# Patient Record
Sex: Female | Born: 1985 | Race: White | Hispanic: No | Marital: Single | Smoking: Never smoker
Health system: Southern US, Community
[De-identification: ages and names within clinical notes are randomized; demographics above are authoritative.]

## PROBLEM LIST (undated history)

## (undated) ENCOUNTER — Inpatient Hospital Stay: Payer: Self-pay

## (undated) ENCOUNTER — Inpatient Hospital Stay (HOSPITAL_COMMUNITY): Payer: Self-pay

## (undated) ENCOUNTER — Emergency Department (HOSPITAL_COMMUNITY): Admission: EM | Payer: Medicaid Other

## (undated) DIAGNOSIS — G43009 Migraine without aura, not intractable, without status migrainosus: Secondary | ICD-10-CM

## (undated) DIAGNOSIS — Z6835 Body mass index (BMI) 35.0-35.9, adult: Secondary | ICD-10-CM

## (undated) DIAGNOSIS — E669 Obesity, unspecified: Secondary | ICD-10-CM

## (undated) DIAGNOSIS — O99213 Obesity complicating pregnancy, third trimester: Secondary | ICD-10-CM

## (undated) HISTORY — PX: TONSILLECTOMY: SUR1361

---

## 2014-06-06 NOTE — L&D Delivery Note (Signed)
Delivery Note At 5:40 PM a viable preterm  female was delivered via Vaginal, Spontaneous Delivery (Presentation: Left Occiput Anterior).  APGAR: 8, 9; weight 4 lb 6.2 oz (1990 g).   Placenta status: Intact, Spontaneous. / marginal abruption Cord: 3 vessels with the following complications: Nuchal cord x1 reduced on perineum.    Anesthesia: Epidural  Episiotomy: None Lacerations: 1st degree;Labial bil Suture Repair: 3.0 chromic Est. Blood Loss (mL): 350    Baby to mother's abdomen and after a minute, cord was clamped and cut. Baby to warmer for initial evaluation by NNP then  back to mother for skin to skin.Pecola Leisure.Baby was breathing without difficulty and  will transition in NICU.  Kensie Susman 05/03/2015, 6:09 PM

## 2014-11-27 ENCOUNTER — Emergency Department
Admission: EM | Admit: 2014-11-27 | Discharge: 2014-11-27 | Disposition: A | Discharge status: Home / Self Care | Payer: Medicaid Other | Attending: Emergency Medicine | Admitting: Emergency Medicine

## 2014-11-27 ENCOUNTER — Encounter: Payer: Self-pay | Admitting: Urgent Care

## 2014-11-27 ENCOUNTER — Emergency Department: Payer: Medicaid Other

## 2014-11-27 DIAGNOSIS — Z79899 Other long term (current) drug therapy: Secondary | ICD-10-CM | POA: Diagnosis not present

## 2014-11-27 DIAGNOSIS — O2341 Unspecified infection of urinary tract in pregnancy, first trimester: Secondary | ICD-10-CM | POA: Insufficient documentation

## 2014-11-27 DIAGNOSIS — R1030 Lower abdominal pain, unspecified: Secondary | ICD-10-CM

## 2014-11-27 DIAGNOSIS — N390 Urinary tract infection, site not specified: Secondary | ICD-10-CM

## 2014-11-27 DIAGNOSIS — Z3A01 Less than 8 weeks gestation of pregnancy: Secondary | ICD-10-CM | POA: Diagnosis not present

## 2014-11-27 DIAGNOSIS — O200 Threatened abortion: Secondary | ICD-10-CM | POA: Diagnosis not present

## 2014-11-27 DIAGNOSIS — Z8742 Personal history of other diseases of the female genital tract: Secondary | ICD-10-CM

## 2014-11-27 DIAGNOSIS — O209 Hemorrhage in early pregnancy, unspecified: Secondary | ICD-10-CM | POA: Diagnosis present

## 2014-11-27 LAB — URINALYSIS COMPLETE WITH MICROSCOPIC (ARMC ONLY)
BILIRUBIN URINE: NEGATIVE
GLUCOSE, UA: NEGATIVE mg/dL
KETONES UR: NEGATIVE mg/dL
Nitrite: NEGATIVE
Protein, ur: 30 mg/dL — AB
SPECIFIC GRAVITY, URINE: 1.02 (ref 1.005–1.030)
pH: 6 (ref 5.0–8.0)

## 2014-11-27 LAB — CBC
HCT: 40.3 % (ref 35.0–47.0)
Hemoglobin: 13.5 g/dL (ref 12.0–16.0)
MCH: 29.8 pg (ref 26.0–34.0)
MCHC: 33.4 g/dL (ref 32.0–36.0)
MCV: 89.2 fL (ref 80.0–100.0)
Platelets: 242 10*3/uL (ref 150–440)
RBC: 4.52 MIL/uL (ref 3.80–5.20)
RDW: 13.7 % (ref 11.5–14.5)
WBC: 11.7 10*3/uL — ABNORMAL HIGH (ref 3.6–11.0)

## 2014-11-27 LAB — HCG, QUANTITATIVE, PREGNANCY: HCG, BETA CHAIN, QUANT, S: 88971 m[IU]/mL — AB (ref <5)

## 2014-11-27 LAB — ABO/RH
ABO/RH(D): A POS
ABO/RH(D): A POS

## 2014-11-27 MED ORDER — CEPHALEXIN 500 MG PO CAPS: 500.0000 mg | ORAL_CAPSULE | Freq: Once | ORAL | Status: DC

## 2014-11-27 MED ORDER — CEPHALEXIN 500 MG PO CAPS
500.0000 mg | ORAL_CAPSULE | Freq: Four times a day (QID) | ORAL | Status: AC
Start: 2014-11-27 — End: 2014-12-07

## 2014-11-27 NOTE — ED Provider Notes (Signed)
[facility] Emergency Department Provider Note  ____________________________________________  Time seen: Approximately 7:19 AM  I have reviewed the triage vital signs and the nursing notes.   HISTORY  Chief Complaint Vaginal Bleeding and Abdominal Cramping   HPI Peggy Gaines is a 29 y.o. female 's hand vaginal bleeding and some lower abdominal cramping. Patient states that she is [redacted] weeks pregnant and that the vaginal bleeding started yesterday. Patient states it initially started just a couple of drops of blood "and now it was more heavy this morning. Patient states that she is a G2 P0 with one miscarriage in the past. Patient denies any dysuria frequency or hematuria she denies any significant vaginal discharge other than the intermittent bleeding. Patient states she has been nauseated with this pregnancy but she is not vomiting. And she does not have any significant abdominal pain other than some mild suprapubic pain.   History reviewed. No pertinent past medical history.  There are no active problems to display for this patient.   History reviewed. No pertinent past surgical history.  Current Outpatient Rx  Name  Route  Sig  Dispense  Refill  . Prenatal Vit-Fe Fumarate-FA (PRENATAL MULTIVITAMIN) TABS tablet   Oral   Take 1 tablet by mouth daily at 12 noon.         . cephALEXin (KEFLEX) 500 MG capsule   Oral   Take 1 capsule (500 mg total) by mouth 4 (four) times daily.   40 capsule   0     Allergies Review of patient's allergies indicates no known allergies.  No family history on file.  Social History History  Substance Use Topics  . Smoking status: Never Smoker   . Smokeless tobacco: Not on file  . Alcohol Use: No    Review of Systems Constitutional: No fever/chills Eyes: No visual changes. ENT: No sore throat. Cardiovascular: Denies chest pain. Respiratory: Denies shortness of breath. Gastrointestinal: Mild abdominal pain.   No nausea, no vomiting.  No diarrhea.  No constipation. Genitourinary: Negative for dysuria. Intermittent vaginal bleeding and patient is in her first trimester pregnancy. Musculoskeletal: Negative for back pain. Skin: Negative for rash. Neurological: Negative for headaches, focal weakness or numbness.  10-point ROS otherwise negative.  ____________________________________________   PHYSICAL EXAM:  VITAL SIGNS: ED Triage Vitals  Enc Vitals Group     BP 11/27/14 0236 131/95 mmHg     Pulse Rate 11/27/14 0236 95     Resp 11/27/14 0236 18     Temp 11/27/14 0236 98.9 F (37.2 C)     Temp Source 11/27/14 0236 Oral     SpO2 11/27/14 0236 100 %     Weight 11/27/14 0236 251 lb (113.853 kg)     Height 11/27/14 0236  (1.727 m)     Head Cir --      Peak Flow --      Pain Score 11/27/14 0237 5     Pain Loc --      Pain Edu? --      Excl. in GC? --     Constitutional: Alert and oriented. Well appearing and in no acute distress. Eyes: Conjunctivae are normal. PERRL. EOMI. Head: Atraumatic. Nose: No congestion/rhinnorhea. Mouth/Throat: Mucous membranes are moist.  Oropharynx non-erythematous. Neck: No stridor.   Cardiovascular: Normal rate, regular rhythm. Grossly normal heart sounds.  Good peripheral circulation. Respiratory: Normal respiratory effort.  No retractions. Lungs CTAB. Gastrointestinal: Soft and nontender. No distention. No abdominal bruits. No CVA tenderness. Patient has  minimal suprapubic tenderness Musculoskeletal: No lower extremity tenderness nor edema.  No joint effusions. Neurologic:  Normal speech and language. No gross focal neurologic deficits are appreciated. Speech is normal. No gait instability. Skin:  Skin is warm, dry and intact. No rash noted. Psychiatric: Mood and affect are normal. Speech and behavior are normal. Patient artery had a transvaginal ultrasound. The patient is not wanting a pelvic exam this time because she says that she was somewhat sore  from that procedure. She denies any significant vaginal discharge other than the bleeding. The bleeding has slowed down at this time.  ____________________________________________   LABS (all labs ordered are listed, but only abnormal results are displayed)  Labs Reviewed  HCG, QUANTITATIVE, PREGNANCY - Abnormal; Notable for the following:    hCG, Beta Chain, Quant, S 05397 (*)    All other components within normal limits  URINALYSIS COMPLETEWITH MICROSCOPIC ([facility] ONLY) - Abnormal; Notable for the following:    Color, Urine YELLOW (*)    APPearance HAZY (*)    Hgb urine dipstick 3+ (*)    Protein, ur 30 (*)    Leukocytes, UA TRACE (*)    Bacteria, UA FEW (*)    Squamous Epithelial / LPF 0-5 (*)    All other components within normal limits  CBC - Abnormal; Notable for the following:    WBC 11.7 (*)    All other components within normal limits  ABO/RH  ABO/RH   ____________________________________________  EKG  None ____________________________________________  RADIOLOGY  US Ob Comp Less 14 Wks  11/27/2014   CLINICAL DATA:  Light vaginal bleeding for 2-3 hours. Initial encounter.  EXAM: OBSTETRIC <14 WK [place] AND TRANSVAGINAL OB US  TECHNIQUE: Both transabdominal and transvaginal ultrasound examinations were performed for complete evaluation of the gestation as well as the maternal uterus, adnexal regions, and pelvic cul-de-sac. Transvaginal technique was performed to assess early pregnancy.  COMPARISON:  None.  FINDINGS: Intrauterine gestational sac: Visualized/normal in shape.  Yolk sac:  No  Embryo:  Yes  Cardiac Activity: Yes  Heart Rate: 153  bpm  CRL:  4.47 cm   11 w   2 d                  [place] EDC: 06/16/2015  Maternal uterus/adnexae: No subchorionic hemorrhage is noted.  The ovaries are unremarkable in appearance. The right ovary measures 3.5 x 2.2 x 2.3 cm, while the left ovary measures 3.1 x 2.3 x 2.4 cm. A 4.6 x 3.9 x 2.9 cm cyst is noted superior to the left ovary,  essentially anechoic in nature.  No free fluid is seen within the pelvic cul-de-sac.  IMPRESSION: 1. Single live intrauterine pregnancy noted, with a crown-rump length measuring 4.5 cm, corresponding to a gestational age of [redacted] weeks 2 days. This does not match the gestational age by LMP, and reflects a new estimated date of delivery of June 16, 2015. 2. 4.6 cm simple cyst noted arising superior to the left ovary, likely physiologic in nature.   Electronically Signed   By: Roanna Raider M.D.   On: 11/27/2014 06:14   US Ob Transvaginal  11/27/2014   CLINICAL DATA:  Light vaginal bleeding for 2-3 hours. Initial encounter.  EXAM: OBSTETRIC <14 WK [place] AND TRANSVAGINAL OB US  TECHNIQUE: Both transabdominal and transvaginal ultrasound examinations were performed for complete evaluation of the gestation as well as the maternal uterus, adnexal regions, and pelvic cul-de-sac. Transvaginal technique was performed to assess early pregnancy.  COMPARISON:  None.  FINDINGS: Intrauterine gestational sac: Visualized/normal in shape.  Yolk sac:  No  Embryo:  Yes  Cardiac Activity: Yes  Heart Rate: 153  bpm  CRL:  4.47 cm   11 w   2 d                  [place] EDC: 06/16/2015  Maternal uterus/adnexae: No subchorionic hemorrhage is noted.  The ovaries are unremarkable in appearance. The right ovary measures 3.5 x 2.2 x 2.3 cm, while the left ovary measures 3.1 x 2.3 x 2.4 cm. A 4.6 x 3.9 x 2.9 cm cyst is noted superior to the left ovary, essentially anechoic in nature.  No free fluid is seen within the pelvic cul-de-sac.  IMPRESSION: 1. Single live intrauterine pregnancy noted, with a crown-rump length measuring 4.5 cm, corresponding to a gestational age of [redacted] weeks 2 days. This does not match the gestational age by LMP, and reflects a new estimated date of delivery of June 16, 2015. 2. 4.6 cm simple cyst noted arising superior to the left ovary, likely physiologic in nature.   Electronically Signed   By: Roanna Raider M.D.   On:  11/27/2014 06:14    ____________________________________________   PROCEDURES  Procedure(s) performed: None  Critical Care performed: No  ____________________________________________   INITIAL IMPRESSION / ASSESSMENT AND PLAN / ED COURSE  Pertinent labs & imaging results that were available during my care of the patient were reviewed by me and considered in my medical decision making (see chart for details).  Patient had pelvic ultrasound which showed that she was actually 11 weeks and 2 days and her quantitative beta analysis was elevated at a great level.. Patient's urinalysis showed that she had a pretty significant urinary tract infection. Patient is going to be placed on Keflex for her urinary infection and she was told to do pelvic rest for the next 3-4 days and to follow-up as soon as possible with her OB/GYN for the threatened miscarriage. She is to return immediately if condition worsens ____________________________________________   FINAL CLINICAL IMPRESSION(S) / ED DIAGNOSES  Final diagnoses:  Lower abdominal pain  Urinary tract infection without hematuria, site unspecified  Threatened miscarriage      Leona Carry, MD 11/27/14 [phone]

## 2014-11-27 NOTE — ED Notes (Signed)
Pt reports vaginal bleeding and generalized abd cramping. Currently [redacted] weeks pregnant per her last period. First pregnancy. States blood is dark red in color and initially was said to be more like spotting and later was more like a period. Urinary frequency the past few days.

## 2014-11-27 NOTE — ED Notes (Signed)
Patient presents with c/o moderate vaginal bleeding that began within the last hour. Patient denies "pain", however is experiencing some abdominal/pelvic cramping. Of note, patient is currently 6 weeks preg (G2P0A1s).

## 2014-12-04 ENCOUNTER — Other Ambulatory Visit: Payer: Self-pay | Admitting: Advanced Practice Midwife

## 2014-12-04 DIAGNOSIS — Z369 Encounter for antenatal screening, unspecified: Secondary | ICD-10-CM

## 2014-12-04 LAB — OB RESULTS CONSOLE HIV ANTIBODY (ROUTINE TESTING): HIV: NONREACTIVE

## 2014-12-05 LAB — OB RESULTS CONSOLE RPR: RPR: NONREACTIVE

## 2014-12-05 LAB — OB RESULTS CONSOLE HEPATITIS B SURFACE ANTIGEN: Hepatitis B Surface Ag: NEGATIVE

## 2014-12-05 LAB — OB RESULTS CONSOLE GC/CHLAMYDIA
Chlamydia: NEGATIVE
Gonorrhea: NEGATIVE

## 2015-01-16 LAB — OB RESULTS CONSOLE VARICELLA ZOSTER ANTIBODY, IGG: VARICELLA IGG: IMMUNE

## 2015-03-23 ENCOUNTER — Encounter: Payer: Self-pay | Admitting: *Deleted

## 2015-03-23 ENCOUNTER — Observation Stay
Admission: EM | Admit: 2015-03-23 | Discharge: 2015-03-23 | Disposition: A | Discharge status: Home / Self Care | Payer: Medicaid Other | Attending: Certified Nurse Midwife | Admitting: Certified Nurse Midwife

## 2015-03-23 DIAGNOSIS — O368120 Decreased fetal movements, second trimester, not applicable or unspecified: Secondary | ICD-10-CM | POA: Diagnosis not present

## 2015-03-23 DIAGNOSIS — Z3A27 27 weeks gestation of pregnancy: Secondary | ICD-10-CM | POA: Diagnosis not present

## 2015-03-23 DIAGNOSIS — O368190 Decreased fetal movements, unspecified trimester, not applicable or unspecified: Secondary | ICD-10-CM | POA: Diagnosis present

## 2015-03-23 HISTORY — DX: Obesity, unspecified: E66.9

## 2015-03-23 NOTE — OB Triage Note (Signed)
Recvd to OBS 2 per wheelchair complaining of hematemesis x1 today and decreased fetal movement.  States that she hasn't been feeling well for the last few days.  H/A, pressure, sore throat.  Denies ear pain.  Changed to gown and to bed.  EFM applied with use of US.

## 2015-03-23 NOTE — Plan of Care (Signed)
Discharge instructions given and explained.  Verbalized understanding.  Signed copy to chart and one in hand.

## 2015-03-23 NOTE — ED Notes (Signed)
Pt reports is approximately [redacted] weeks pregnant and this am she vomitted blood x's 1. Pt denies pain just reports feels nauseated.

## 2015-03-24 ENCOUNTER — Encounter: Payer: Self-pay | Admitting: Certified Nurse Midwife

## 2015-03-24 NOTE — Progress Notes (Signed)
L&D Triage Note  [age] year old G2 P0010 with EDC=06/16/2015 by an 11wk2d ultrasound presents to L&D at 27.6 weeks with c/o decreased fetal movement today. Has not been feeling well with URI sx x several days including sinus congestion, sore throat, headache. This AM vomited x1 and noticed some blood steaks in the vomitus. Has also had blood when blowing nose. Usually vomits 1-2 times a day. Takes Zantac for heartburn, but that has not been helping. Today has eaten some crackers and a bowl of soup. Has been on no meds for nausea.Has been drinking some water.   [facility] at [facility] and [facility] remarkable for obesity and UTIs x2. Has a history of migraines.Current medications include PNV and Zantac.  NKDA  Exam : General : in NAD Throat: no inflammation or exudates Neck: No LAN Nares: inflammed and irritated mucosa Heart: RRR without murmur Lungs: CTA FHR: 135 baseline with accelerations to 150s to 160s Toco: no ctxs Ultrasound: breech presentation, anterior/ fundal placenta AFI: 5.82+ 2.83+2.15cm=10.8 cm  A: IUP at 27.6 weeks with reactive NST and normal AFI Blood in emesis:  R/O Mallory Weiss tear vs blood from nares  P: DC home FKCs explained DC Zantac and start Protonix 40 mg daily Soft foods for the next week Sudafed and normal saline for nasal congestion Diclegis 2 caps before bed, can repeat one in AM #100/ RF x1 FU this week in office as scheduled.  Farrel ConnersGUTIERREZ, Peggy Gaines, CNM

## 2015-04-30 ENCOUNTER — Inpatient Hospital Stay
Admission: EM | Admit: 2015-04-30 | Discharge: 2015-05-05 | DRG: 775 | Disposition: A | Discharge status: Home / Self Care | Payer: Medicaid Other | Attending: Obstetrics and Gynecology | Admitting: Obstetrics and Gynecology

## 2015-04-30 DIAGNOSIS — Z23 Encounter for immunization: Secondary | ICD-10-CM

## 2015-04-30 DIAGNOSIS — O9081 Anemia of the puerperium: Secondary | ICD-10-CM | POA: Diagnosis not present

## 2015-04-30 DIAGNOSIS — Z3A33 33 weeks gestation of pregnancy: Secondary | ICD-10-CM

## 2015-04-30 DIAGNOSIS — Z6838 Body mass index (BMI) 38.0-38.9, adult: Secondary | ICD-10-CM

## 2015-04-30 DIAGNOSIS — D62 Acute posthemorrhagic anemia: Secondary | ICD-10-CM | POA: Diagnosis not present

## 2015-04-30 DIAGNOSIS — O42913 Preterm premature rupture of membranes, unspecified as to length of time between rupture and onset of labor, third trimester: Secondary | ICD-10-CM | POA: Diagnosis present

## 2015-04-30 DIAGNOSIS — Z789 Other specified health status: Secondary | ICD-10-CM | POA: Diagnosis not present

## 2015-04-30 DIAGNOSIS — O99213 Obesity complicating pregnancy, third trimester: Secondary | ICD-10-CM | POA: Diagnosis present

## 2015-04-30 DIAGNOSIS — O42919 Preterm premature rupture of membranes, unspecified as to length of time between rupture and onset of labor, unspecified trimester: Secondary | ICD-10-CM | POA: Diagnosis present

## 2015-04-30 HISTORY — DX: Obesity complicating pregnancy, third trimester: O99.213

## 2015-04-30 HISTORY — DX: Body mass index (BMI) 38.0-38.9, adult: Z68.38

## 2015-04-30 HISTORY — DX: Migraine without aura, not intractable, without status migrainosus: G43.009

## 2015-04-30 HISTORY — DX: Body mass index (BMI) 35.0-35.9, adult: Z68.35

## 2015-04-30 LAB — URINE DRUG SCREEN, QUALITATIVE (ARMC ONLY)
Amphetamines, Ur Screen: NOT DETECTED
BARBITURATES, UR SCREEN: NOT DETECTED
Benzodiazepine, Ur Scrn: NOT DETECTED
COCAINE METABOLITE, UR ~~LOC~~: NOT DETECTED
Cannabinoid 50 Ng, Ur ~~LOC~~: NOT DETECTED
MDMA (Ecstasy)Ur Screen: NOT DETECTED
METHADONE SCREEN, URINE: NOT DETECTED
Opiate, Ur Screen: NOT DETECTED
Phencyclidine (PCP) Ur S: NOT DETECTED
TRICYCLIC, UR SCREEN: NOT DETECTED

## 2015-04-30 LAB — URINALYSIS COMPLETE WITH MICROSCOPIC (ARMC ONLY)
BILIRUBIN URINE: NEGATIVE
Bacteria, UA: NONE SEEN
GLUCOSE, UA: NEGATIVE mg/dL
KETONES UR: NEGATIVE mg/dL
NITRITE: NEGATIVE
Protein, ur: NEGATIVE mg/dL
Specific Gravity, Urine: 1.01 (ref 1.005–1.030)
pH: 7 (ref 5.0–8.0)

## 2015-04-30 LAB — CBC
HEMATOCRIT: 38 % (ref 35.0–47.0)
HEMOGLOBIN: 12.9 g/dL (ref 12.0–16.0)
MCH: 30.8 pg (ref 26.0–34.0)
MCHC: 33.9 g/dL (ref 32.0–36.0)
MCV: 90.6 fL (ref 80.0–100.0)
Platelets: 275 10*3/uL (ref 150–440)
RBC: 4.2 MIL/uL (ref 3.80–5.20)
RDW: 14.6 % — ABNORMAL HIGH (ref 11.5–14.5)
WBC: 10.4 10*3/uL (ref 3.6–11.0)

## 2015-04-30 LAB — TYPE AND SCREEN
ABO/RH(D): A POS
ANTIBODY SCREEN: NEGATIVE

## 2015-04-30 LAB — CHLAMYDIA/NGC RT PCR (ARMC ONLY)
CHLAMYDIA TR: NOT DETECTED
N GONORRHOEAE: NOT DETECTED

## 2015-04-30 LAB — RAPID HIV SCREEN (HIV 1/2 AB+AG)
HIV 1/2 Antibodies: NONREACTIVE
HIV-1 P24 Antigen - HIV24: NONREACTIVE

## 2015-04-30 MED ORDER — LACTATED RINGERS IV SOLN
INTRAVENOUS | Status: DC
  Administered 2015-04-30: 16:00:00 via INTRAVENOUS
  Administered 2015-05-01: 125 mL via INTRAVENOUS
  Administered 2015-05-01 – 2015-05-03 (×3): via INTRAVENOUS

## 2015-04-30 MED ORDER — ERYTHROMYCIN BASE 250 MG PO TBEC
250.0000 mg | DELAYED_RELEASE_TABLET | Freq: Four times a day (QID) | ORAL | Status: DC
  Administered 2015-05-02 – 2015-05-03 (×2): 250 mg via ORAL
  Filled 2015-04-30 (×9): qty 1

## 2015-04-30 MED ORDER — AMOXICILLIN 250 MG PO CAPS
250.0000 mg | ORAL_CAPSULE | Freq: Three times a day (TID) | ORAL | Status: DC
  Administered 2015-05-02 – 2015-05-03 (×2): 250 mg via ORAL
  Filled 2015-04-30 (×7): qty 1

## 2015-04-30 MED ORDER — SODIUM CHLORIDE 0.9 % IV SOLN
250.0000 mg | Freq: Four times a day (QID) | INTRAVENOUS | Status: AC
  Administered 2015-04-30 – 2015-05-02 (×8): 250 mg via INTRAVENOUS
  Filled 2015-04-30 (×13): qty 5

## 2015-04-30 MED ORDER — OXYTOCIN 10 UNIT/ML IJ SOLN: 10.0000 [IU] | Freq: Once | INTRAMUSCULAR | Status: DC

## 2015-04-30 MED ORDER — BETAMETHASONE SOD PHOS & ACET 6 (3-3) MG/ML IJ SUSP
12.0000 mg | Freq: Every day | INTRAMUSCULAR | Status: AC
  Administered 2015-04-30 – 2015-05-01 (×2): 12 mg via INTRAMUSCULAR
  Filled 2015-04-30: qty 2

## 2015-04-30 MED ORDER — LIDOCAINE HCL (PF) 1 % IJ SOLN
30.0000 mL | INTRAMUSCULAR | Status: DC | PRN
  Filled 2015-04-30: qty 30

## 2015-04-30 MED ORDER — LACTATED RINGERS IV SOLN: 500.0000 mL | INTRAVENOUS | Status: DC | PRN

## 2015-04-30 MED ORDER — OXYTOCIN 40 UNITS IN LACTATED RINGERS INFUSION - SIMPLE MED: 62.5000 mL/h | INTRAVENOUS | Status: DC

## 2015-04-30 MED ORDER — CITRIC ACID-SODIUM CITRATE 334-500 MG/5ML PO SOLN
30.0000 mL | ORAL | Status: DC | PRN
Start: 2015-04-30 — End: 2015-05-01

## 2015-04-30 MED ORDER — SODIUM CHLORIDE 0.9 % IV SOLN
2.0000 g | Freq: Four times a day (QID) | INTRAVENOUS | Status: AC
  Administered 2015-04-30 – 2015-05-02 (×8): 2 g via INTRAVENOUS
  Filled 2015-04-30 (×11): qty 2000

## 2015-04-30 MED ORDER — ONDANSETRON HCL 4 MG/2ML IJ SOLN: 4.0000 mg | Freq: Four times a day (QID) | INTRAMUSCULAR | Status: DC | PRN

## 2015-04-30 MED ORDER — OXYTOCIN BOLUS FROM INFUSION: 500.0000 mL | INTRAVENOUS | Status: DC

## 2015-04-30 NOTE — H&P (Signed)
OB History & Physical   History of Present Illness:  Chief Complaint: vaginal bleeding  HPI:  Peggy Gaines is a 29 y.o. G2P0010 female at 365w2d dated by 11 week ultrasound.  Her pregnancy has been complicated by Obesity with BMI 38, failed 28 week 1 hour glucose tolerance test (Passed 3 hour test with all normal values).    She denies contractions.   She denies leakage of fluid.   She reports vginal bleeding.   She reports fetal movement.    She notes getting up at about 1130am today and going to urinate and having the sensation that she was having a period-like discharge.  When she wiped she noted some red-tinged fluid on the napkin and there was what appeared to be blood in the toilet water. She has continued to have small amount of blood-tinged discharge on the napkin when she urinates.  She denies abdominal pain, fever, chills, urinary symptoms, contractions.  She only notes mild soreness in her lower abdomen.  She had intercourse last night and had no bleeding afterward.  Maternal Medical History:   Past Medical History  Diagnosis Date  . Obesity   . Obesity affecting pregnancy in third trimester, antepartum 04/30/2015  . BMI 35.0-35.9,adult   . Migraine without aura     Past Surgical History  Procedure Laterality Date  . Tonsillectomy     Allergies: No Known Allergies  Prior to Admission medications   Medication Sig Start Date End Date Taking? Authorizing Provider  Prenatal Vit-Fe Fumarate-FA (PRENATAL MULTIVITAMIN) TABS tablet Take 1 tablet by mouth daily at 12 noon.    Historical Provider, MD   OB History  Gravida Para Term Preterm AB SAB TAB Ectopic Multiple Living  2    1 1         # Outcome Date GA Lbr Len/2nd Weight Sex Delivery Anes PTL Lv  2 Current           1 SAB               Prenatal care site: Initially [facility], [facility]  Social History: She  reports that she has never smoked. She does not have any smokeless tobacco history on  file. She reports that she does not drink alcohol or use illicit drugs.  Family History: family history is not on file.   Review of Systems: Negative x 10 systems reviewed except as noted in the HPI.    Physical Exam:  Vital Signs: BP 134/79 mmHg  Pulse 97  Temp(Src) 97.6 F (36.4 C) (Oral)  Resp 18  LMP 10/21/2014 (Exact Date) General: no acute distress.  HEENT: normocephalic, atraumatic Heart: regular rate & rhythm.  No murmurs/rubs/gallops Lungs: clear to auscultation bilaterally Abdomen: soft, gravid, non-tender;  EFW: 3.5 pounds Pelvic:   External: Normal external female genitalia  Cervix: Dilation: Closed /   /     ROM: + pooling; nitrazine not done due to frank blood in the pooling; + ferning (florid) Extremities: non-tender, symmetric, no edema bilaterally.    Neurologic: Alert & oriented x 3.    Pertinent Results:  Prenatal Labs: Blood type/Rh A positive  Antibody screen negative  Rubella Unknown  RPR Non Reactive  HBsAg negative  HIV negative  GC negative  Chlamydia negative  Genetic screening Declined  1 hour GTT 140  3 hour GTT 93, 163, 133, 110 (all normal)  GBS unknown   Baseline FHR: 130 beats/min   Variability: moderate   Accelerations: present   Decelerations:  absent Contractions: present frequency: infrequent Overall assessment: Category 1  Bedside Ultrasound:  Number of Fetus: 1  Presentation: cephalic, spine mat right  Fluid: subjectively low to absent  Placental Location: anterior/right  Assessment:  Peggy Gaines is a 29 y.o. G53P0010 female at [redacted]w[redacted]d with PPROM, obesity (with BMI 38), no evidence of preterm labor at this time.   Plan:  1. Admit to Labor & Delivery  2. CBC, T&S, Clrs, IVF 3. PPROM:  Betamethasone per protocol, latency PPROM antibiotics (ampicillin, erythromycin).  Discussed risk of chorioamnionitis, preterm labor, placental abruption.  Discussed that will need to deliver at 34 weeks.  Will have neonatology see patient to  counsel her regarding what to expect from a premature infant.  Will get UDS, gonorrhea/chlamydia NAAT, UA. 4. Rubella unknown :will collect with labs 5. GBS Collected today, pending.   6. Fetwal well-being: reassuring overall  Conard Novak, MD 04/30/2015 2:55 PM

## 2015-04-30 NOTE — Consult Note (Signed)
Neonatology Consult to Antenatal Patient:  I was asked by Dr. Jean RosenthalJackson to see this patient in order to provide antenatal counseling due to SROM at 33 2/7 weeks.  Ms. Peggy Gaines is being admitted today at 3233 2/[redacted] weeks GA following SROM at about 1130 today. She is a G2P0A1 A pos, GBS pending with a history of E. Coli UTI, treated during this pregnancy. The baby is female. She is currently not having active labor and her cervix is closed. She is getting BMZ and IV Ampicillin and Erythromycin.  I spoke with the patient and her fiance. We discussed the worst case scenario of delivery in the next 1-2 days, including usual DR management, possible respiratory complications and need for support, IV access, feedings (mother desires breast feeding, which was encouraged), LOS, Mortality and Morbidity, and long term outcomes. They had a few questions, which I answered. The father of the baby has ADHD and learning disabilities, per his report, so they understand the increase in risk for developmental problems for this infant. I offered a NICU tour to them and would be glad to come back if they have more questions later.  Thank you for asking me to see this patient.  Doretha Souhristie C. Seward Coran, MD Neonatologist  The total length of face-to-face or floor/unit time for this encounter was 25 minutes. Counseling and/or coordination of care was 20 minutes of the above.

## 2015-04-30 NOTE — OB Triage Note (Signed)
Ms. Peggy Gaines here with c/o vaginal bleeding this am, describes as pink/bloody mucous after visit to bathroom. Pt reports most recent intercourse as last night. Pt also reports some mild "period-like" cramps as well over the past couple of days, intermittently, go away with rest.

## 2015-05-01 MED ORDER — INFLUENZA VAC SPLIT QUAD 0.5 ML IM SUSY
0.5000 mL | PREFILLED_SYRINGE | INTRAMUSCULAR | Status: AC
  Administered 2015-05-04: 0.5 mL via INTRAMUSCULAR
  Filled 2015-05-01 (×2): qty 0.5

## 2015-05-01 NOTE — Progress Notes (Signed)
Patient ID: Peggy Gaines, female   DOB: 02/02/1986, 29 y.o.   MRN: [id number] Daily Antepartum Progress Note Peggy Gaines  [id number]  HD#2 Subjective:  Overnight Events: None Complaints: None She denies: fevers, chills, chest pain, trouble breathing, nausea, vomiting, severe abdominal pain, vaginal bleeding (apart from scant blood-tinged fluid continuing to leak). Also, denies contractions. She notes good fetal movement. She has tolerated: normal diet as tolerated She is ambulating and is voiding.   Objective:  Most recent vitals Temp: 98.1 F (36.7 C)  BP: 112/62 mmHg  Pulse Rate: 81  Resp: 18      Vitals Range over 24 hours BP  Min: 107/59  Max: 134/79 Temp  Avg: 98.3 F (36.8 C)  Min: 97.6 F (36.4 C)  Max: 98.8 F (37.1 C) Pulse  Avg: 86.7  Min: 76  Max: 97   Physical Exam General: alert, well appearing, and in no distress Heart: regular rate and rhythm, no murmurs Lungs: clear to auscultation, no wheezes, rales or rhonchi, symmetric air entry Abdomen: abdomen is soft without significant tenderness, masses, organomegaly or guarding. Gravid, non-tender Extremities: no redness or tenderness in the calves or thighs, no edema  EFM: 130bpm baseline, +accels, no decels, moderate variability. TOCO: No ctx  AM Labs Lab Results  Component Value Date   WBC 10.4 04/30/2015   HGB 12.9 04/30/2015   HCT 38.0 04/30/2015   PLT 275 04/30/2015     Assessment:  Peggy Gaines is a 29 y.o. female at 7280w3d gestational age HD/#2 admitted with PPROM. No signs of infection, labor, or abruption.   Plan:  1) PPROM: continue latency antibiotics, second dose of betamethasone today.  Rubella lab pending. GBS pending 2) FWB: reassuring overall 3) DVT Prophylaxis:  SCDs  Conard NovakJackson, Nyeli Holtmeyer D, MD  05/01/2015 11:32 AM

## 2015-05-01 NOTE — Plan of Care (Signed)
SCD's applied and explained to pt.

## 2015-05-01 NOTE — Progress Notes (Signed)
L&D Progress Note  S: in no pain. No contractions  O: afebrile. 90/53 Tolerating regular diet FHR 130s with accelerations to 150s, moderate variability Toco: essentially acontractile Just received second dose of BMZ  A: PPROM at 33 2/7 weeks, now 33 3/7 weeks Cat 1 FHR tracing No evidence of chorio  P[facility] Continue antibiotics as ordered Plan IOL at 34 weeks (tentatively scheduled for 29 November NST twice daily starting tomorrow Temp and vs q4hours FHTs q8hours SCDs and BR with BRPs  Delma Drone, CNM

## 2015-05-02 LAB — CBC WITH DIFFERENTIAL/PLATELET
Basophils Absolute: 0 10*3/uL (ref 0–0.1)
Basophils Relative: 0 %
EOS PCT: 0 %
Eosinophils Absolute: 0 10*3/uL (ref 0–0.7)
HEMATOCRIT: 33.7 % — AB (ref 35.0–47.0)
HEMOGLOBIN: 11 g/dL — AB (ref 12.0–16.0)
LYMPHS ABS: 0.9 10*3/uL — AB (ref 1.0–3.6)
LYMPHS PCT: 7 %
MCH: 29.8 pg (ref 26.0–34.0)
MCHC: 32.5 g/dL (ref 32.0–36.0)
MCV: 91.6 fL (ref 80.0–100.0)
Monocytes Absolute: 0.5 10*3/uL (ref 0.2–0.9)
Monocytes Relative: 4 %
NEUTROS ABS: 11.4 10*3/uL — AB (ref 1.4–6.5)
NEUTROS PCT: 89 %
Platelets: 247 10*3/uL (ref 150–440)
RBC: 3.68 MIL/uL — AB (ref 3.80–5.20)
RDW: 14.4 % (ref 11.5–14.5)
WBC: 12.8 10*3/uL — AB (ref 3.6–11.0)

## 2015-05-02 NOTE — Progress Notes (Signed)
[facility] Pt alert and orientated, procedure reviewed with pt, no questions or concerns voiced by pt.

## 2015-05-02 NOTE — Progress Notes (Signed)
Patient ID: Peggy Gaines, female   DOB: 12/27/1985, 29 y.o.   MRN: [id number] Daily Antepartum Progress Note Peggy Gaines  [id number]  HD#3 Subjective:  Overnight Events: None Complaints: None She denies: fevers, chills, chest pain, trouble breathing, nausea, vomiting, severe abdominal pain, vaginal bleeding, contractions. She notes good fetal movement. She has tolerated: normal diet as tolerated She is ambulating and is voiding.   Objective:  Most recent vitals Temp: 97.8 F (36.6 C)  BP: 111/66 mmHg  Pulse Rate: 86  Resp: 18  SpO2: 98 %   Vitals Range over 24 hours BP  Min: 90/53  Max: 122/72 Temp  Avg: 98.4 F (36.9 C)  Min: 97.8 F (36.6 C)  Max: 99 F (37.2 C) Pulse  Avg: 78.7  Min: 67  Max: 87 SpO2  Avg: 98.7 %  Min: 98 %  Max: 99 %   Physical Exam General: alert, well appearing, and in no distress Heart: regular rate and rhythm, no murmurs Lungs: clear to auscultation, no wheezes, rales or rhonchi, symmetric air entry Abdomen: abdomen is soft without significant tenderness, masses, organomegaly or guarding. Gravid, non-tender Extremities: no redness or tenderness in the calves or thighs, no edema  EFM: 130bpm baseline, +accels, no decels, moderate variability. R NST yesterday. TOCO: No ctx  AM Labs Lab Results  Component Value Date   WBC 10.4 04/30/2015   HGB 12.9 04/30/2015   HCT 38.0 04/30/2015   PLT 275 04/30/2015     Assessment:  Peggy Gaines is a 29 y.o. female at 692w4d gestational age HD/#3 admitted with PPROM. No signs of infection, labor, or abruption.   Plan:  1) PPROM: continue latency antibiotics, change to po ABX today.  Has received 2 doses of betamethasone.        CBC today.  Cont to monitor for fever, infection, or labor or bleeding.       Plan IOL at 34 weeks (Tues). 2) FWB: reassuring overall 3) DVT Prophylaxis:  SCDs  Letitia LibraHARRIS,Ashanna Heinsohn PAUL, MD  05/02/2015 9:28 AM

## 2015-05-02 NOTE — Progress Notes (Signed)
A NST procedure was performed with FHR monitoring and a normal baseline established, appropriate time of 20-40 minutes of evaluation, and accels >2 seen w 15x15 characteristics.  Results show a REACTIVE NST.   Baseline 130s.  No decels. No ctxs.  Labs reviewed.  WBC up slightly to 12.8.  Hgb 11. No fever.  Cont to monitor

## 2015-05-02 NOTE — Progress Notes (Signed)
Nst complete, pt for transfer to back to her room.

## 2015-05-03 ENCOUNTER — Inpatient Hospital Stay: Payer: Medicaid Other | Admitting: Anesthesiology

## 2015-05-03 ENCOUNTER — Encounter: Payer: Self-pay | Admitting: Anesthesiology

## 2015-05-03 LAB — RUBELLA SCREEN: Rubella: <0.9 index — ABNORMAL LOW (ref >0.99)

## 2015-05-03 LAB — CBC
HCT: 34.6 % — ABNORMAL LOW (ref 35.0–47.0)
Hemoglobin: 11.4 g/dL — ABNORMAL LOW (ref 12.0–16.0)
MCH: 30.1 pg (ref 26.0–34.0)
MCHC: 33 g/dL (ref 32.0–36.0)
MCV: 91.3 fL (ref 80.0–100.0)
PLATELETS: 268 10*3/uL (ref 150–440)
RBC: 3.79 MIL/uL — ABNORMAL LOW (ref 3.80–5.20)
RDW: 15 % — AB (ref 11.5–14.5)
WBC: 15.1 10*3/uL — ABNORMAL HIGH (ref 3.6–11.0)

## 2015-05-03 LAB — RPR: RPR Ser Ql: NONREACTIVE

## 2015-05-03 MED ORDER — MISOPROSTOL 200 MCG PO TABS
800.0000 ug | ORAL_TABLET | Freq: Once | ORAL | Status: DC | PRN
  Filled 2015-05-03: qty 4

## 2015-05-03 MED ORDER — SODIUM CHLORIDE 0.9 % IV SOLN
INTRAVENOUS | Status: AC
  Administered 2015-05-03: 1 g via INTRAVENOUS
  Filled 2015-05-03: qty 1000

## 2015-05-03 MED ORDER — SCOPOLAMINE 1 MG/3DAYS TD PT72
1.0000 | MEDICATED_PATCH | Freq: Once | TRANSDERMAL | Status: DC
Start: 2015-05-03 — End: 2015-05-05
  Filled 2015-05-03: qty 1

## 2015-05-03 MED ORDER — WITCH HAZEL-GLYCERIN EX PADS: 1.0000 "application " | MEDICATED_PAD | CUTANEOUS | Status: DC | PRN

## 2015-05-03 MED ORDER — KETOROLAC TROMETHAMINE 30 MG/ML IJ SOLN: 30.0000 mg | Freq: Four times a day (QID) | INTRAMUSCULAR | Status: DC | PRN

## 2015-05-03 MED ORDER — ONDANSETRON HCL 4 MG/2ML IJ SOLN: 4.0000 mg | INTRAMUSCULAR | Status: DC | PRN

## 2015-05-03 MED ORDER — OXYTOCIN 40 UNITS IN LACTATED RINGERS INFUSION - SIMPLE MED
62.5000 mL/h | INTRAVENOUS | Status: DC
  Administered 2015-05-03: 62.5 mL/h via INTRAVENOUS
  Filled 2015-05-03: qty 1000

## 2015-05-03 MED ORDER — BUTORPHANOL TARTRATE 1 MG/ML IJ SOLN
INTRAMUSCULAR | Status: AC
  Administered 2015-05-03: 2 mg via INTRAVENOUS
  Filled 2015-05-03: qty 2

## 2015-05-03 MED ORDER — DOCUSATE SODIUM 100 MG PO CAPS
100.0000 mg | ORAL_CAPSULE | Freq: Every day | ORAL | Status: DC
  Administered 2015-05-04 – 2015-05-05 (×2): 100 mg via ORAL
  Filled 2015-05-03 (×3): qty 1

## 2015-05-03 MED ORDER — OXYTOCIN 40 UNITS IN LACTATED RINGERS INFUSION - SIMPLE MED: 62.5000 mL/h | INTRAVENOUS | Status: DC | PRN

## 2015-05-03 MED ORDER — ONDANSETRON HCL 4 MG/2ML IJ SOLN: 4.0000 mg | Freq: Four times a day (QID) | INTRAMUSCULAR | Status: DC | PRN

## 2015-05-03 MED ORDER — DIBUCAINE 1 % RE OINT: 1.0000 "application " | TOPICAL_OINTMENT | RECTAL | Status: DC | PRN

## 2015-05-03 MED ORDER — BUTORPHANOL TARTRATE 1 MG/ML IJ SOLN
2.0000 mg | INTRAMUSCULAR | Status: DC | PRN
  Administered 2015-05-03 (×2): 2 mg via INTRAVENOUS
  Filled 2015-05-03: qty 2

## 2015-05-03 MED ORDER — TERBUTALINE SULFATE 1 MG/ML IJ SOLN
0.2500 mg | Freq: Once | INTRAMUSCULAR | Status: DC | PRN
  Filled 2015-05-03: qty 1

## 2015-05-03 MED ORDER — NALOXONE HCL 0.4 MG/ML IJ SOLN: 0.4000 mg | INTRAMUSCULAR | Status: DC | PRN

## 2015-05-03 MED ORDER — MEPERIDINE HCL 25 MG/ML IJ SOLN: 6.2500 mg | INTRAMUSCULAR | Status: DC | PRN

## 2015-05-03 MED ORDER — LACTATED RINGERS IV SOLN
INTRAVENOUS | Status: DC
  Administered 2015-05-03: 500 mL via INTRAVENOUS
  Administered 2015-05-03: 13:00:00 via INTRAVENOUS

## 2015-05-03 MED ORDER — DIPHENHYDRAMINE HCL 25 MG PO CAPS: 25.0000 mg | ORAL_CAPSULE | ORAL | Status: DC | PRN

## 2015-05-03 MED ORDER — IBUPROFEN 600 MG PO TABS
600.0000 mg | ORAL_TABLET | Freq: Four times a day (QID) | ORAL | Status: DC
  Administered 2015-05-03 – 2015-05-05 (×5): 600 mg via ORAL
  Filled 2015-05-03 (×5): qty 1

## 2015-05-03 MED ORDER — BUPIVACAINE HCL (PF) 0.25 % IJ SOLN
INTRAMUSCULAR | Status: DC | PRN
  Administered 2015-05-03: 5 mL via EPIDURAL

## 2015-05-03 MED ORDER — SODIUM CHLORIDE 0.9 % IV SOLN
1.0000 g | INTRAVENOUS | Status: DC
  Administered 2015-05-03 (×3): 1 g via INTRAVENOUS
  Filled 2015-05-03 (×2): qty 1000

## 2015-05-03 MED ORDER — NALBUPHINE HCL 10 MG/ML IJ SOLN
5.0000 mg | Freq: Once | INTRAMUSCULAR | Status: DC | PRN
  Filled 2015-05-03: qty 0.5

## 2015-05-03 MED ORDER — LACTATED RINGERS IV SOLN: 500.0000 mL | INTRAVENOUS | Status: DC | PRN

## 2015-05-03 MED ORDER — DIPHENHYDRAMINE HCL 50 MG/ML IJ SOLN: 12.5000 mg | INTRAMUSCULAR | Status: DC | PRN

## 2015-05-03 MED ORDER — FERROUS SULFATE 325 (65 FE) MG PO TABS
325.0000 mg | ORAL_TABLET | Freq: Every day | ORAL | Status: DC
  Administered 2015-05-04 – 2015-05-05 (×2): 325 mg via ORAL
  Filled 2015-05-03 (×2): qty 1

## 2015-05-03 MED ORDER — DEXTROSE 5 % IV SOLN
1.0000 ug/kg/h | INTRAVENOUS | Status: DC | PRN
  Filled 2015-05-03: qty 2

## 2015-05-03 MED ORDER — FENTANYL 2.5 MCG/ML W/ROPIVACAINE 0.2% IN NS 100 ML EPIDURAL INFUSION (ARMC-ANES)
EPIDURAL | Status: AC
  Administered 2015-05-03: 10 mL/h via EPIDURAL
  Filled 2015-05-03: qty 100

## 2015-05-03 MED ORDER — OXYTOCIN BOLUS FROM INFUSION
500.0000 mL | INTRAVENOUS | Status: DC
  Administered 2015-05-03: 500 mL via INTRAVENOUS

## 2015-05-03 MED ORDER — LANOLIN HYDROUS EX OINT: TOPICAL_OINTMENT | CUTANEOUS | Status: DC | PRN

## 2015-05-03 MED ORDER — NALBUPHINE HCL 10 MG/ML IJ SOLN
5.0000 mg | INTRAMUSCULAR | Status: DC | PRN
  Filled 2015-05-03: qty 0.5

## 2015-05-03 MED ORDER — SIMETHICONE 80 MG PO CHEW: 80.0000 mg | CHEWABLE_TABLET | ORAL | Status: DC | PRN

## 2015-05-03 MED ORDER — AMMONIA AROMATIC IN INHA: 0.3000 mL | Freq: Once | RESPIRATORY_TRACT | Status: DC | PRN

## 2015-05-03 MED ORDER — PRENATAL MULTIVITAMIN CH
1.0000 | ORAL_TABLET | Freq: Every day | ORAL | Status: DC
  Administered 2015-05-04 – 2015-05-05 (×2): 1 via ORAL
  Filled 2015-05-03 (×3): qty 1

## 2015-05-03 MED ORDER — ONDANSETRON HCL 4 MG/2ML IJ SOLN: 4.0000 mg | Freq: Three times a day (TID) | INTRAMUSCULAR | Status: DC | PRN

## 2015-05-03 MED ORDER — OXYTOCIN 40 UNITS IN LACTATED RINGERS INFUSION - SIMPLE MED
1.0000 m[IU]/min | INTRAVENOUS | Status: DC
  Administered 2015-05-03: 1 m[IU]/min via INTRAVENOUS

## 2015-05-03 MED ORDER — SODIUM CHLORIDE 0.9 % IJ SOLN: 3.0000 mL | INTRAMUSCULAR | Status: DC | PRN

## 2015-05-03 MED ORDER — BENZOCAINE-MENTHOL 20-0.5 % EX AERO
1.0000 "application " | INHALATION_SPRAY | CUTANEOUS | Status: DC | PRN
  Administered 2015-05-05: 1 via TOPICAL
  Filled 2015-05-03: qty 56

## 2015-05-03 MED ORDER — ONDANSETRON HCL 4 MG PO TABS: 4.0000 mg | ORAL_TABLET | ORAL | Status: DC | PRN

## 2015-05-03 MED ORDER — LIDOCAINE HCL (PF) 1 % IJ SOLN
30.0000 mL | INTRAMUSCULAR | Status: DC | PRN
  Filled 2015-05-03: qty 30

## 2015-05-03 NOTE — Discharge Summary (Signed)
Physician Obstetric Discharge Summary  Patient ID: Peggy Gaines MRN: [id number] DOB/AGE: 06-26-1985 29 y.o.   Date of Admission: 04/30/2015  Date of Discharge: 05/05/2015  Admitting Diagnosis: premature preterm rupture of membranes at [redacted]w[redacted]d Secondary Diagnosis: none  Mode of Delivery: normal spontaneous vaginal delivery on 05/03/2015       Discharge Diagnosis: PPROM, Preterm labor and delivery at 33wk5d/ Nuchal cord x1. Suspected marginal abruption   Intrapartum Procedures: epidural, GBS prophylaxis, pitocin augmentation, placement of intrauterine catheter and repair of superficial bilateral lacerations   Post partum procedures: MMR  Complications: None   Brief Hospital Course  Peggy Kimmeyis a [id number] was admitted 11/24 with PPROM of blood tinged amniotic fluid. She was monitored for 24 hours, given betamethasone x 2 doses, and was begun on latency antibiotics. She started into labor on 11/27 AM and  had a SVD on 05/03/2015 after a Pitocin augmented labor delivering a 4#6oz female in occiput anterior with nuchal cord x1;  for further details of this delivery, please refer to the delivery note.  Patient had an uncomplicated postpartum course.  By time of discharge on PPD#2, her pain was controlled on oral pain medications; she had appropriate lochia and was ambulating, voiding without difficulty and tolerating regular diet.  She was deemed stable for discharge to home.     Labs: CBC Latest Ref Rng 05/04/2015 05/03/2015 05/02/2015  WBC 3.6 - 11.0 K/uL 13.4(H) 15.1(H) 12.8(H)  Hemoglobin 12.0 - 16.0 g/dL 11.0(L) 11.4(L) 11.0(L)  Hematocrit 35.0 - 47.0 % 32.9(L) 34.6(L) 33.7(L)  Platelets 150 - 440 K/uL 229 268 247   A POS// RI//VI// GBS negative. TDAP given 04/09/15  Physical exam:  Blood pressure 119/77, pulse 76, temperature 98.5 F (36.9 C), temperature source Oral, resp. rate 18, height 5' 8"  (1.727 m), weight 254 lb (115.214 kg), last menstrual period  10/21/2014, SpO2 99 %, unknown if currently breastfeeding. Patient is pumping General: alert and no distress Lochia: appropriate Abdomen: soft, NT Uterine Fundus: firm at U/ML/NT Extremities: No evidence of DVT seen on physical exam. No lower extremity edema.  Discharge Instructions: Per After Visit Summary. Activity: Advance as tolerated. Pelvic rest for 6 weeks.  Also refer to Discharge Instructions Diet: Regular Medications:   Medication List    STOP taking these medications        Doxylamine-Pyridoxine 10-10 MG Tbec      TAKE these medications        benzocaine-Menthol 20-0.5 % Aero  Commonly known as:  DERMOPLAST  Apply 1 application topically as needed for irritation (perineal discomfort).     ibuprofen 600 MG tablet  Commonly known as:  ADVIL,MOTRIN  Take 1 tablet (600 mg total) by mouth every 6 (six) hours as needed.     lanolin Oint  Apply 1 application topically as needed (for breast care).     pantoprazole 40 MG tablet  Commonly known as:  PROTONIX  Take 40 mg by mouth daily.     prenatal multivitamin Tabs tablet  Take 1 tablet by mouth daily at 12 noon.       Outpatient follow up:  Follow-up Information    Follow up with Ednah Hammock, CNM. Call in 3 days.   Specialty:  Certified Nurse Midwife   Why:  6week check   Contact information:   [address] [phone]     Postpartum contraception: undecided, discussed options  Discharged Condition: stable  Discharged to: home   Newborn Data: Disposition:NICU  Apgars:  APGAR (1 MIN): 8   APGAR (5 MINS): 9   APGAR (10 MINS):    Baby Feeding: Breast/ 4#6oz//Peggy Gaines, CNM 05/05/2015 9:22 AM

## 2015-05-03 NOTE — Progress Notes (Addendum)
L&D Progress Note   S: Brought back to L&D with c/o frequent contractions starting around 0400. Contractions becoming progressively more intense. Rates them a 4/10. Continue to have leakage of blood tinged fluid.  O: General: appears uncomfortable, some moaning with contractions BP 138/84 mmHg  Pulse 69  Temp(Src) 98 F (36.7 C) (Oral)  Resp 18  Ht  (1.727 m)  Wt 254 lb (115.214 kg)  BMI 38.63 kg/m2  SpO2 98%  LMP 10/21/2014 (Exact Date)  Abdomen: tender fundus with contractions, difficulty palpating ctxs or picking them up with toco.  FHR: 135 with accelerations to 150s to 160s, mod variability Toco: contractions ?q1-2 minutes Cervix: 2/90%/-2 (difficulty feeling left side of cx)/vtx on ultrasound (OP) Results for orders placed or performed during the hospital encounter of 04/30/15 (from the past 72 hour(s))  CBC     Status: Abnormal   Collection Time: 04/30/15  4:28 PM  Result Value Ref Range   WBC 10.4 3.6 - 11.0 K/uL   RBC 4.20 3.80 - 5.20 MIL/uL   Hemoglobin 12.9 12.0 - 16.0 g/dL   HCT 16.1 09.6 - 04.5 %   MCV 90.6 80.0 - 100.0 fL   MCH 30.8 26.0 - 34.0 pg   MCHC 33.9 32.0 - 36.0 g/dL   RDW 40.9 (H) 81.1 - 91.4 %   Platelets 275 150 - 440 K/uL  Type and screen     Status: None   Collection Time: 04/30/15  4:28 PM  Result Value Ref Range   ABO/RH(D) A POS    Antibody Screen NEG    Sample Expiration 05/03/2015   Urinalysis complete, with microscopic ([facility] only)     Status: Abnormal   Collection Time: 04/30/15  4:28 PM  Result Value Ref Range   Color, Urine YELLOW (A) YELLOW   APPearance HAZY (A) CLEAR   Glucose, UA NEGATIVE NEGATIVE mg/dL   Bilirubin Urine NEGATIVE NEGATIVE   Ketones, ur NEGATIVE NEGATIVE mg/dL   Specific Gravity, Urine 1.010 1.005 - 1.030   Hgb urine dipstick 1+ (A) NEGATIVE   pH 7.0 5.0 - 8.0   Protein, ur NEGATIVE NEGATIVE mg/dL   Nitrite NEGATIVE NEGATIVE   Leukocytes, UA TRACE (A) NEGATIVE   RBC / HPF 6-30 0 - 5 RBC/hpf   WBC, UA 0-5  0 - 5 WBC/hpf   Bacteria, UA NONE SEEN NONE SEEN   Squamous Epithelial / LPF 6-30 (A) NONE SEEN   Mucous PRESENT   Urine Drug Screen, Qualitative ([facility] only)     Status: None   Collection Time: 04/30/15  4:28 PM  Result Value Ref Range   Tricyclic, Ur Screen NONE DETECTED NONE DETECTED   Amphetamines, Ur Screen NONE DETECTED NONE DETECTED   MDMA (Ecstasy)Ur Screen NONE DETECTED NONE DETECTED   Cocaine Metabolite,Ur [place] NONE DETECTED NONE DETECTED   Opiate, Ur Screen NONE DETECTED NONE DETECTED   Phencyclidine (PCP) Ur S NONE DETECTED NONE DETECTED   Cannabinoid 50 Ng, Ur [place] NONE DETECTED NONE DETECTED   Barbiturates, Ur Screen NONE DETECTED NONE DETECTED   Benzodiazepine, Ur Scrn NONE DETECTED NONE DETECTED   Methadone Scn, Ur NONE DETECTED NONE DETECTED    Comment: (NOTE) 100  Tricyclics, urine               Cutoff 1000 ng/mL 200  Amphetamines, urine             Cutoff 1000 ng/mL 300  MDMA (Ecstasy), urine  Cutoff 500 ng/mL 400  Cocaine Metabolite, urine       Cutoff 300 ng/mL 500  Opiate, urine                   Cutoff 300 ng/mL 600  Phencyclidine (PCP), urine      Cutoff 25 ng/mL 700  Cannabinoid, urine              Cutoff 50 ng/mL 800  Barbiturates, urine             Cutoff 200 ng/mL 900  Benzodiazepine, urine           Cutoff 200 ng/mL 1000 Methadone, urine                Cutoff 300 ng/mL 1100 1200 The urine drug screen provides only a preliminary, unconfirmed 1300 analytical test result and should not be used for non-medical 1400 purposes. Clinical consideration and professional judgment should 1500 be applied to any positive drug screen result due to possible 1600 interfering substances. A more specific alternate chemical method 1700 must be used in order to obtain a confirmed analytical result.  1800 Gas chromato graphy / mass spectrometry (GC/MS) is the preferred 1900 confirmatory method.   Chlamydia/NGC rt PCR ([facility] only)     Status: None   Collection Time:  04/30/15  4:28 PM  Result Value Ref Range   Specimen source GC/Chlam ENDOCERVICAL    Chlamydia Tr NOT DETECTED NOT DETECTED   N gonorrhoeae NOT DETECTED NOT DETECTED    Comment: (NOTE) 100  This methodology has not been evaluated in pregnant women or in 200  patients with a history of hysterectomy. 300 400  This methodology will not be performed on patients less than 26  years of age.   Rapid HIV screen (HIV 1/2 Ab+Ag) ([facility] Only)     Status: None   Collection Time: 04/30/15  4:28 PM  Result Value Ref Range   HIV-1 P24 Antigen - HIV24 NON REACTIVE NON REACTIVE   HIV 1/2 Antibodies NON REACTIVE NON REACTIVE   Interpretation (HIV Ag Ab)      A non reactive test result means that HIV 1 or HIV 2 antibodies and HIV 1 p24 antigen were not detected in the specimen.  Culture, beta strep (group b only)     Status: None (Preliminary result)   Collection Time: 04/30/15  4:28 PM  Result Value Ref Range   Specimen Description VAGINAL/RECTAL    Special Requests NONE    Culture NO BETA HEMOLYTIC STREPTOCOCCI ISOLATED    Report Status PENDING   CBC with Differential/Platelet     Status: Abnormal   Collection Time: 05/02/15  9:48 AM  Result Value Ref Range   WBC 12.8 (H) 3.6 - 11.0 K/uL   RBC 3.68 (L) 3.80 - 5.20 MIL/uL   Hemoglobin 11.0 (L) 12.0 - 16.0 g/dL   HCT 91.4 (L) 78.2 - 95.6 %   MCV 91.6 80.0 - 100.0 fL   MCH 29.8 26.0 - 34.0 pg   MCHC 32.5 32.0 - 36.0 g/dL   RDW 21.3 08.6 - 57.8 %   Platelets 247 150 - 440 K/uL   Neutrophils Relative % 89 %   Neutro Abs 11.4 (H) 1.4 - 6.5 K/uL   Lymphocytes Relative 7 %   Lymphs Abs 0.9 (L) 1.0 - 3.6 K/uL   Monocytes Relative 4 %   Monocytes Absolute 0.5 0.2 - 0.9 K/uL   Eosinophils Relative 0 %   Eosinophils Absolute  0.0 0 - 0.7 K/uL   Basophils Relative 0 %   Basophils Absolute 0.0 0 - 0.1 K/uL   A: PPROM 11/24, now in early labor A mild decrease in hemoglobin?dilutional effect. R/O abruption Cat 1 tracing  P: Discussed plan of  management with Dr Tiburcio PeaHarris: Pitocin if labor does not progress spontaneously DC po antibiotics-restart Ampicillin IV Repeat CBC Stadol for pain  Malayiah Mcbrayer, CNM

## 2015-05-03 NOTE — Anesthesia Procedure Notes (Signed)
Epidural Patient location during procedure: OB  Staffing Anesthesiologist: Peggy AddisonHOMAS, Peggy Gaines Performed by: anesthesiologist   Preanesthetic Checklist Completed: patient identified, site marked, surgical consent, pre-op evaluation, timeout performed, IV checked, risks and benefits discussed and monitors and equipment checked  Epidural Patient position: sitting Prep: Betadine Patient monitoring: heart rate, continuous pulse ox and blood pressure Approach: midline Location: L4-L5 Injection technique: LOR saline  Needle:  Needle type: Tuohy  Needle gauge: 18 G Needle length: 9 cm and 9 Catheter type: closed end flexible Catheter size: 20 Guage Test dose: negative and 1.5% lidocaine with Epi 1:200 K  Assessment Sensory level: T10 Events: blood not aspirated, injection not painful, no injection resistance, negative IV test and no paresthesia  Additional Notes   Patient tolerated the insertion well without complications. 1635 test. 1639 bolus. Infusion 1643.Reason for block:procedure for pain

## 2015-05-03 NOTE — Anesthesia Preprocedure Evaluation (Signed)
Anesthesia Evaluation  Patient identified by MRN, date of birth, ID band Patient awake    Reviewed: Allergy & Precautions, NPO status , Patient's Chart, lab work & pertinent test results, reviewed documented beta blocker date and time   Airway Mallampati: II  TM Distance: >3 FB     Dental  (+) Chipped   Pulmonary           Cardiovascular      Neuro/Psych  Headaches,    GI/Hepatic   Endo/Other    Renal/GU      Musculoskeletal   Abdominal   Peds  Hematology   Anesthesia Other Findings Obese.  Reproductive/Obstetrics                             Anesthesia Physical Anesthesia Plan  ASA: III  Anesthesia Plan: Epidural   Post-op Pain Management:    Induction:   Airway Management Planned:   Additional Equipment:   Intra-op Plan:   Post-operative Plan:   Informed Consent: I have reviewed the patients History and Physical, chart, labs and discussed the procedure including the risks, benefits and alternatives for the proposed anesthesia with the patient or authorized representative who has indicated his/her understanding and acceptance.     Plan Discussed with:   Anesthesia Plan Comments:         Anesthesia Quick Evaluation

## 2015-05-03 NOTE — Progress Notes (Signed)
L&D Progress Note  S: moaning with contractions. Ready for epidural  O: BP 110/96 mmHg  Pulse 66  Temp(Src) 98.1 F (36.7 C) (Oral)  Resp 18  Ht 5\' 8"  (1.727 m)  Wt 254 lb (115.214 kg)  BMI 38.63 kg/m2  SpO2 98%  LMP 10/21/2014 (Exact Date)   Contractions q2 min on 3 miu/min Pitocin FHR: 130s with accelerations to 150s to 180 Cervix: 4/C/-1  A: Progressing in labor  P: Epidural anesthesia Anival Pasha, CNM

## 2015-05-03 NOTE — Plan of Care (Signed)
Problem: Life Cycle: Goal: Risk for postpartum hemorrhage will decrease Outcome: Progressing Lochial Flow WNL. Fundus Firm at U/-1  Problem: Nutritional: Goal: Dietary intake will improve Outcome: Progressing Tolerating Reg. Diet.

## 2015-05-03 NOTE — Progress Notes (Signed)
L&D Progress Note  S: Stadol decreased contractions, but starting to feel them again  O: BP 140/78 mmHg  Pulse 77  Temp(Src) 98.3 F (36.8 C) (Oral)  Resp 18  Ht 5\' 8"  (1.727 m)  Wt 254 lb (115.214 kg)  BMI 38.63 kg/m2  SpO2 98%  LMP 10/21/2014 (Exact Date)  Pitocin begun a little earlier and is now at 2 miu/min Difficulty seeing contractions with toco Cervix: almost feels like a LEEP cx-found opening in completely effaced cervix and able to manipulate and dilate to 2.5 cm before inserting IUPC. Contractions q2-3 min apart FHR: 135 with accels to 160s, moderate variability. No significant decelerations Results for orders placed or performed during the hospital encounter of 04/30/15 (from the past 24 hour(s))  CBC     Status: Abnormal   Collection Time: 05/03/15  8:57 AM  Result Value Ref Range   WBC 15.1 (H) 3.6 - 11.0 K/uL   RBC 3.79 (L) 3.80 - 5.20 MIL/uL   Hemoglobin 11.4 (L) 12.0 - 16.0 g/dL   HCT 16.134.6 (L) 09.635.0 - 04.547.0 %   MCV 91.3 80.0 - 100.0 fL   MCH 30.1 26.0 - 34.0 pg   MCHC 33.0 32.0 - 36.0 g/dL   RDW 40.915.0 (H) 81.111.5 - 91.414.5 %   Platelets 268 150 - 440 K/uL   A: PPROM since 04/30/15 s/p BMZ x2 with rising WBCs and onset of labor this AM. ?early chorio Now augmenting labor to help move toward delivery  P: Continue Pitocin Epidural when appropriate  Jarmal Lewelling, CNM

## 2015-05-04 LAB — CULTURE, BETA STREP (GROUP B ONLY)

## 2015-05-04 LAB — CBC
HEMATOCRIT: 32.9 % — AB (ref 35.0–47.0)
HEMOGLOBIN: 11 g/dL — AB (ref 12.0–16.0)
MCH: 30.4 pg (ref 26.0–34.0)
MCHC: 33.4 g/dL (ref 32.0–36.0)
MCV: 91.2 fL (ref 80.0–100.0)
Platelets: 229 10*3/uL (ref 150–440)
RBC: 3.61 MIL/uL — ABNORMAL LOW (ref 3.80–5.20)
RDW: 14.5 % (ref 11.5–14.5)
WBC: 13.4 10*3/uL — ABNORMAL HIGH (ref 3.6–11.0)

## 2015-05-04 NOTE — Progress Notes (Signed)
  Subjective:  No concerns, doing well.  Minimal lochia  Objective:   Blood pressure 113/70, pulse 79, temperature 98.2 F (36.8 C), temperature source Oral, resp. rate 18, height $RemoveBe'5\' 8"'MHKLZJKaP$  (1.727 m), weight 115.214 kg (254 lb), last menstrual period 10/21/2014, SpO2 100 %, unknown if currently breastfeeding.  General: NAD Pulmonary: no increased work of breathing Abdomen: non-distended, non-tender, fundus firm at level of umbilicus Extremities: no edema, no erythema, no tenderness  Results for orders placed or performed during the hospital encounter of 04/30/15 (from the past 72 hour(s))  CBC with Differential/Platelet     Status: Abnormal   Collection Time: 05/02/15  9:48 AM  Result Value Ref Range   WBC 12.8 (H) 3.6 - 11.0 K/uL   RBC 3.68 (L) 3.80 - 5.20 MIL/uL   Hemoglobin 11.0 (L) 12.0 - 16.0 g/dL   HCT 33.7 (L) 35.0 - 47.0 %   MCV 91.6 80.0 - 100.0 fL   MCH 29.8 26.0 - 34.0 pg   MCHC 32.5 32.0 - 36.0 g/dL   RDW 14.4 11.5 - 14.5 %   Platelets 247 150 - 440 K/uL   Neutrophils Relative % 89 %   Neutro Abs 11.4 (H) 1.4 - 6.5 K/uL   Lymphocytes Relative 7 %   Lymphs Abs 0.9 (L) 1.0 - 3.6 K/uL   Monocytes Relative 4 %   Monocytes Absolute 0.5 0.2 - 0.9 K/uL   Eosinophils Relative 0 %   Eosinophils Absolute 0.0 0 - 0.7 K/uL   Basophils Relative 0 %   Basophils Absolute 0.0 0 - 0.1 K/uL  CBC     Status: Abnormal   Collection Time: 05/03/15  8:57 AM  Result Value Ref Range   WBC 15.1 (H) 3.6 - 11.0 K/uL   RBC 3.79 (L) 3.80 - 5.20 MIL/uL   Hemoglobin 11.4 (L) 12.0 - 16.0 g/dL   HCT 34.6 (L) 35.0 - 47.0 %   MCV 91.3 80.0 - 100.0 fL   MCH 30.1 26.0 - 34.0 pg   MCHC 33.0 32.0 - 36.0 g/dL   RDW 15.0 (H) 11.5 - 14.5 %   Platelets 268 150 - 440 K/uL  CBC     Status: Abnormal   Collection Time: 05/04/15  4:43 AM  Result Value Ref Range   WBC 13.4 (H) 3.6 - 11.0 K/uL   RBC 3.61 (L) 3.80 - 5.20 MIL/uL   Hemoglobin 11.0 (L) 12.0 - 16.0 g/dL   HCT 32.9 (L) 35.0 - 47.0 %   MCV 91.2  80.0 - 100.0 fL   MCH 30.4 26.0 - 34.0 pg   MCHC 33.4 32.0 - 36.0 g/dL   RDW 14.5 11.5 - 14.5 %   Platelets 229 150 - 440 K/uL    Assessment:   29 y.o. [id number] postpartum day # 1 PTSVD  Plan:    1) Acute blood loss anemia - hemodynamically stable and asymptomatic - po ferrous sulfate  2) A pos / RNI / VZI $Rem'[ ]'pbel$  MMR on discharge  5) Disposition - anticipate discharge PPD#2

## 2015-05-04 NOTE — Lactation Note (Signed)
Lactation Consultation Note  Patient Name: Stormy FabianShanna Larrivee ZOXWR'UToday's Date: 05/04/2015  Mom has [employer] DEBP set up in room.  Encouraged to pump every 2 to 3 hours for stimulation.  Label any expressed breast milk with time and date pumped and take to SCN.  [facility].    Maternal Data    Feeding    [facility]/Interventions                      Lactation Tools Discussed/Used     Consult Status      Louis MeckelWilliams, Tyjon Bowen Kay 05/04/2015, 9:50 PM

## 2015-05-04 NOTE — Anesthesia Postprocedure Evaluation (Signed)
Anesthesia Post Note  Patient: Peggy Gaines  Procedure(s) Performed: * No procedures listed *  Patient location during evaluation: [facility] Unit Anesthesia Type: Epidural Level of consciousness: awake and alert Pain management: satisfactory to patient Vital Signs Assessment: post-procedure vital signs reviewed and stable Respiratory status: spontaneous breathing Cardiovascular status: stable Postop Assessment: No headache, No backache and No signs of nausea or vomiting Anesthetic complications: no    Last Vitals:  Filed Vitals:   05/04/15 0250 05/04/15 0716  BP: 112/69 113/70  Pulse: 75 79  Temp: 36.8 C 36.8 C  Resp: 18 18    Last Pain:  Filed Vitals:   05/04/15 0717  PainSc: 0-No pain                 Deryn Massengale,  Margit BandaJohn M

## 2015-05-05 DIAGNOSIS — Z789 Other specified health status: Secondary | ICD-10-CM | POA: Diagnosis not present

## 2015-05-05 HISTORY — DX: Other specified health status: Z78.9

## 2015-05-05 LAB — SURGICAL PATHOLOGY

## 2015-05-05 MED ORDER — BENZOCAINE-MENTHOL 20-0.5 % EX AERO: 1.0000 "application " | INHALATION_SPRAY | CUTANEOUS | Status: DC | PRN

## 2015-05-05 MED ORDER — LANOLIN HYDROUS EX OINT: 1.0000 "application " | TOPICAL_OINTMENT | CUTANEOUS | Status: DC | PRN

## 2015-05-05 MED ORDER — IBUPROFEN 600 MG PO TABS: 600.0000 mg | ORAL_TABLET | Freq: Four times a day (QID) | ORAL | Status: DC | PRN

## 2015-05-05 MED ORDER — MEASLES, MUMPS & RUBELLA VAC ~~LOC~~ INJ
0.5000 mL | INJECTION | Freq: Once | SUBCUTANEOUS | Status: AC
  Administered 2015-05-05: 0.5 mL via SUBCUTANEOUS
  Filled 2015-05-05: qty 0.5

## 2015-05-05 NOTE — Progress Notes (Signed)
Discharge instructions complete. Patient discharged home at 1525.

## 2015-05-05 NOTE — Discharge Instructions (Signed)
° °Vaginal Delivery, Care After °Refer to this sheet in the next few weeks. These discharge instructions provide you with information on caring for yourself after delivery. Your caregiver may also give you specific instructions. Your treatment has been planned according to the most current medical practices available, but problems sometimes occur. Call your caregiver if you have any problems or questions after you go home. °HOME CARE INSTRUCTIONS °1. Take over-the-counter or prescription medicines only as directed by your caregiver or pharmacist. °2. Do not drink alcohol, especially if you are breastfeeding or taking medicine to relieve pain. °3. Do not smoke tobacco. °4. Continue to use good perineal care. Good perineal care includes: °1. Wiping your perineum from back to front °2. Keeping your perineum clean. °3. You can do sitz baths twice a day, to help keep this area clean °5. Do not use tampons, douche or have sex until your caregiver says it is okay. °6. Shower only and avoid sitting in submerged water, aside from sitz baths °7. Wear a well-fitting bra that provides breast support. °8. Eat healthy foods. °9. Drink enough fluids to keep your urine clear or pale yellow. °10. Eat high-fiber foods such as whole grain cereals and breads, brown rice, beans, and fresh fruits and vegetables every day. These foods may help prevent or relieve constipation. °11. Avoid constipation with high fiber foods or medications, such as miralax or metamucil °12. Follow your caregiver's recommendations regarding resumption of activities such as climbing stairs, driving, lifting, exercising, or traveling. °13. Talk to your caregiver about resuming sexual activities. Resumption of sexual activities is dependent upon your risk of infection, your rate of healing, and your comfort and desire to resume sexual activity. °14. Try to have someone help you with your household activities and your newborn for at least a few days after you  leave the hospital. °15. Rest as much as possible. Try to rest or take a nap when your newborn is sleeping. °16. Increase your activities gradually. °17. Keep all of your scheduled postpartum appointments. It is very important to keep your scheduled follow-up appointments. At these appointments, your caregiver will be checking to make sure that you are healing physically and emotionally. °SEEK MEDICAL CARE IF:  °· You are passing large clots from your vagina. Save any clots to show your caregiver. °· You have a foul smelling discharge from your vagina. °· You have trouble urinating. °· You are urinating frequently. °· You have pain when you urinate. °· You have a change in your bowel movements. °· You have increasing redness, pain, or swelling near your vaginal incision (episiotomy) or vaginal tear. °· You have pus draining from your episiotomy or vaginal tear. °· Your episiotomy or vaginal tear is separating. °· You have painful, hard, or reddened breasts. °· You have a severe headache. °· You have blurred vision or see spots. °· You feel sad or depressed. °· You have thoughts of hurting yourself or your newborn. °· You have questions about your care, the care of your newborn, or medicines. °· You are dizzy or light-headed. °· You have a rash. °· You have nausea or vomiting. °· You were breastfeeding and have not had a menstrual period within 12 weeks after you stopped breastfeeding. °· You are not breastfeeding and have not had a menstrual period by the 12th week after delivery. °· You have a fever. °SEEK IMMEDIATE MEDICAL CARE IF:  °· You have persistent pain. °· You have chest pain. °· You have shortness of breath. °·   You faint.  You have leg pain.  You have stomach pain.  Your vaginal bleeding saturates two or more sanitary pads in 1 hour. MAKE SURE YOU:   Understand these instructions.  Will watch your condition.  Will get help right away if you are not doing well or get worse. Document Released:  05/20/2000 Document Revised: 10/07/2013 Document Reviewed: 01/18/2012 [facility] Patient Information 2015 [employer], [place]. This information is not intended to replace advice given to you by your health care provider. Make sure you discuss any questions you have with your health care provider.  Sitz Bath A sitz bath is a warm water bath taken in the sitting position. The water covers only the hips and butt (buttocks). We recommend using one that fits in the toilet, to help with ease of use and cleanliness. It may be used for either healing or cleaning purposes. Sitz baths are also used to relieve pain, itching, or muscle tightening (spasms). The water may contain medicine. Moist heat will help you heal and relax.  HOME CARE  Take 3 to 4 sitz baths a day. 18. Fill the bathtub half-full with warm water. 19. Sit in the water and open the drain a little. 20. Turn on the warm water to keep the tub half-full. Keep the water running constantly. 21. Soak in the water for 15 to 20 minutes. 22. After the sitz bath, pat the affected area dry. GET HELP RIGHT AWAY IF: You get worse instead of better. Stop the sitz baths if you get worse. MAKE SURE YOU:  Understand these instructions.  Will watch your condition.  Will get help right away if you are not doing well or get worse. Document Released: 06/30/2004 Document Revised: 02/15/2012 Document Reviewed: 09/20/2010 [facility] Patient Information 2015 [employer], [place]. This information is not intended to replace advice given to you by your health care provider. Make sure you discuss any questions you have with your health care provider.     We will discuss your surgery once again in detail at your post-op visit in two to four weeks. If you havent already done so, please call to make your appointment as soon as possible.  [place] [place]  [address]  [place], [place] [address] [place], [place] [address]  Phone: [phone] Phone:  [phone]  Fax: [phone] Fax: [phone]

## 2015-05-14 ENCOUNTER — Ambulatory Visit: Payer: Self-pay

## 2015-05-14 NOTE — Lactation Note (Signed)
This note was copied from the chart of Peggy Gaines. Lactation Consultation Note  Patient Name: Peggy Gaines ONGEX'BToday's Date: 05/14/2015 Reason for consult: Follow-up assessment   Maternal Data   Peggy PleasureMoatehr has had poor milk supply, seh now is making 30 to 40 ml every 2 to 4 hours. Feeding Feeding Type: Breast Fed Length of feed: 15 min  LATCH Score/Interventions Latch: Grasps breast easily, tongue down, lips flanged, rhythmical sucking. Intervention(s): Teach feeding cues Intervention(s): Breast compression;Adjust position  Audible Swallowing: Spontaneous and intermittent Intervention(s): Hand expression  Type of Nipple: Everted at rest and after stimulation  Comfort (Breast/Nipple): Soft / non-tender  Problem noted: Filling  Hold (Positioning): Assistance needed to correctly position infant at breast and maintain latch.  LATCH Score: 9  Lactation Tools Discussed/Used Tools: Pump;16F feeding tube / Syringe Nipple shield size: 24 WIC Program: Yes   Consult Status Consult Status: Follow-up    Trudee GripCarolyn P Donyel Nester 05/14/2015, 4:50 PM

## 2015-05-27 ENCOUNTER — Ambulatory Visit
Admission: RE | Admit: 2015-05-27 | Discharge: 2015-05-27 | Disposition: A | Discharge status: Home / Self Care | Payer: Medicaid Other | Source: Ambulatory Visit | Attending: Pediatrics | Admitting: Pediatrics

## 2015-05-27 NOTE — Lactation Note (Signed)
Lactation Consultation Note  Patient Name: Peggy Gaines Peggy Gaines: 05/27/2015     Maternal Data  Mom states she pumps with a Lactina breast pump "every 3 hours", but gets 40-60 ml total each time. She last pumped 40ml 2 1/2 hours ago. She denies taking any medication. She uses 30 mm flanges. She denies taking any medications or having had any illness. Sh ewould like the baby to get all breastmilk and to "mostly" breastfeed. Breasts are quite flaccid. With instruction on hand expression, she was able to get a drop of her milk on nipple. Mom has no risk factors known for low milk supply other than not breastfeeding, Lactina pump and perhaps insufficient pumping frequency. (Mom vague about how often she pumps).    Feeding  Baby alert and rooting despite having 60 ml EBM / HMF an hour ago. I inserted pumped milk into shield chamber (mom needed much help with 24 mm shield which too big now on left breast. Right nipple is larger, so 24 better there). Baby latches and nurses quite well as long as I give some EBM via curved syringe to keep her interested. She nursed for approx 15 minutes in the football hold and took the 16 ml from the syringe. Nothing from the breast alone per the pre/post weight check. Mom finished feedign with slow flow bottle. Has pediatrician appointment tomorrow.  PLAN: Continue to pump at least 8-12 times per 24 hours for 15 minutes each time (hand expresssion before/during/after) and feed baby with slow flow as directed by MD, except now also try 1-2 breastfeedings per day with the nipple [name]. I wouldn't spend much more than 15-20 minutes with each session for now. F/U with LC in 2 weeks  [facility]                      Lactation Tools Discussed/Used     Consult Status      Peggy Gaines

## 2017-05-09 IMAGING — US US OB COMP LESS 14 WK
1 series · 13 of 28 positions shown · non-contrast
Comparison: None.

CLINICAL DATA: Light vaginal bleeding for 2-3 hours. Initial
encounter.

EXAM:
OBSTETRIC <14 WK US AND TRANSVAGINAL OB US
TECHNIQUE: Both transabdominal and transvaginal ultrasound examinations were
performed for complete evaluation of the gestation as well as the
maternal uterus, adnexal regions, and pelvic cul-de-sac.
Transvaginal technique was performed to assess early pregnancy.

[Series 1: us ob comp less 14 wk · 0.28mm/px · 13 of 57 slices shown]
[im 3/57]
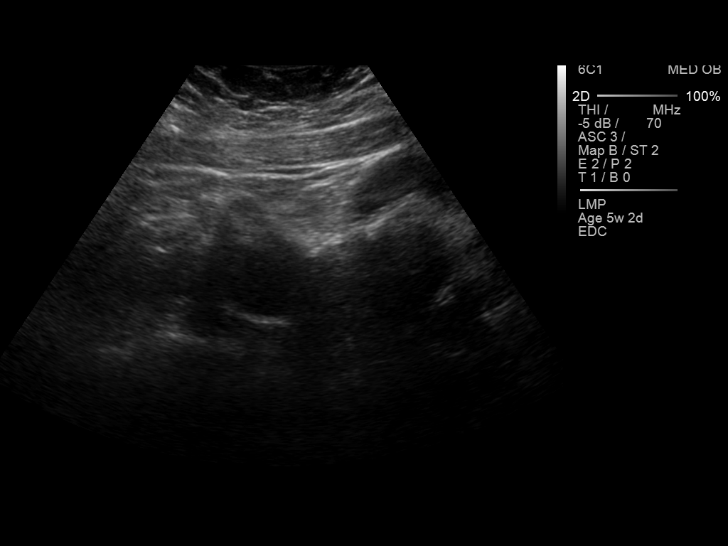
[im 7/57]
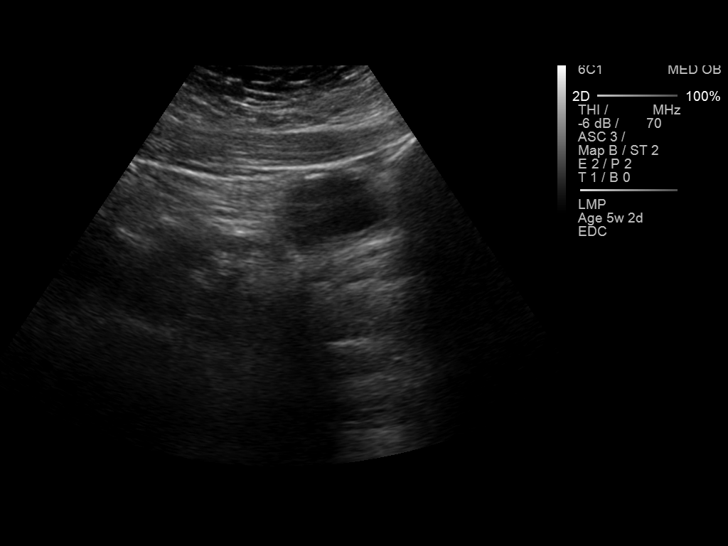
[im 11/57]
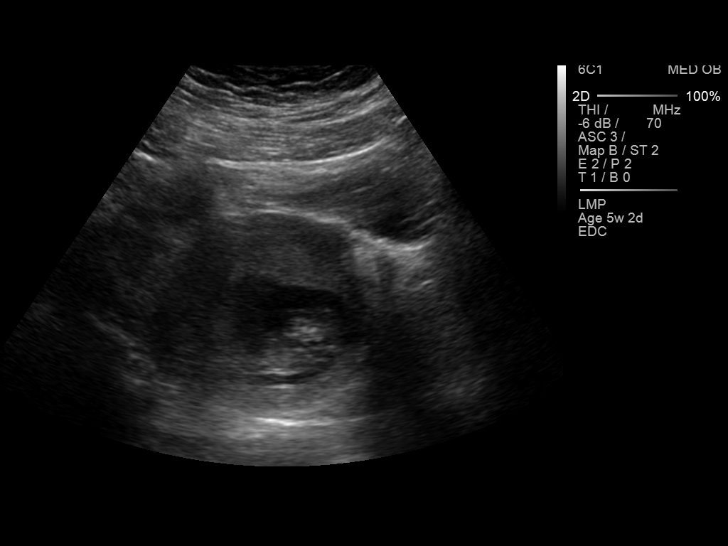
[im 15/57]
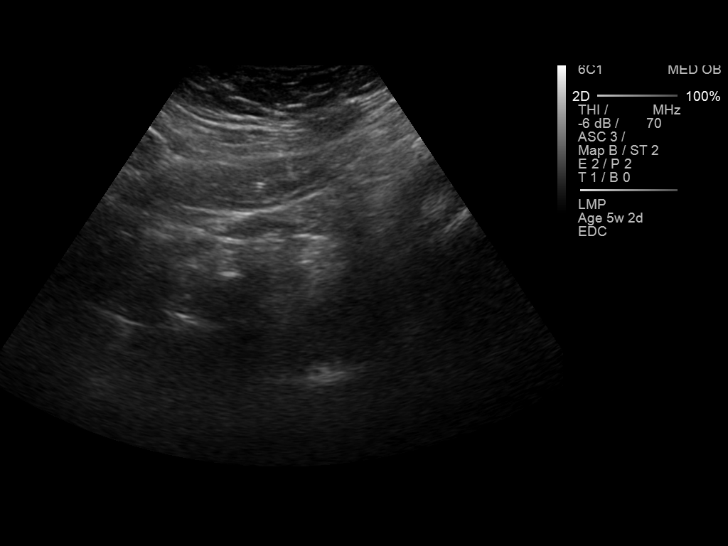
[im 19/57]
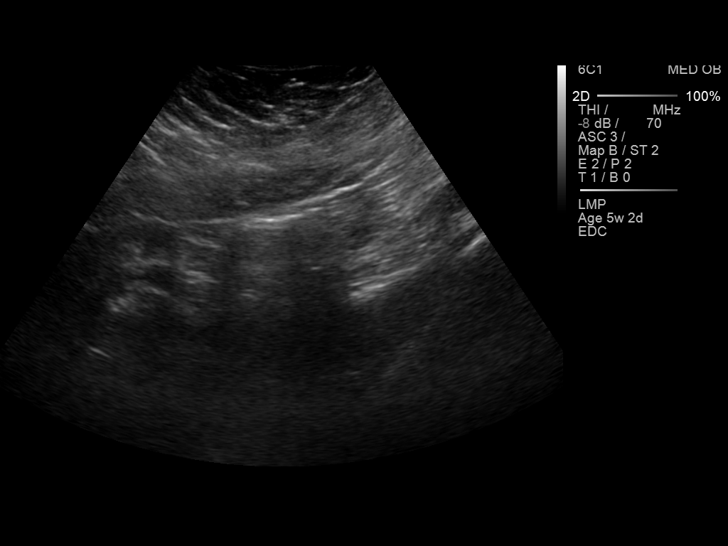
[im 23/57]
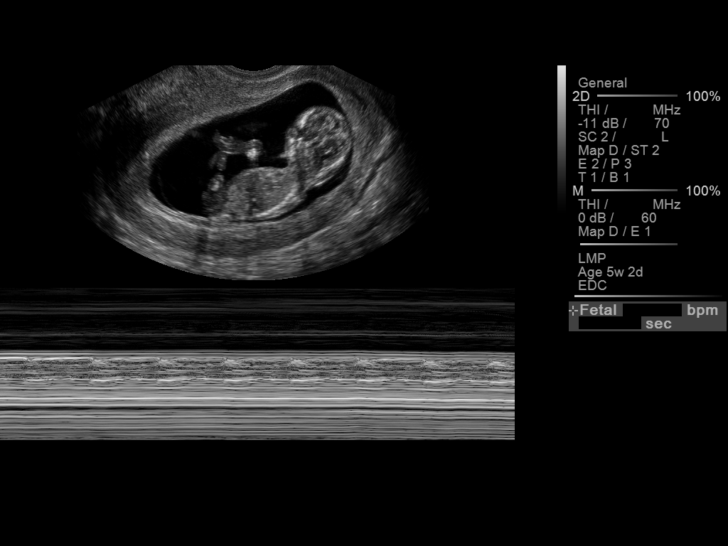
[im 30/57]
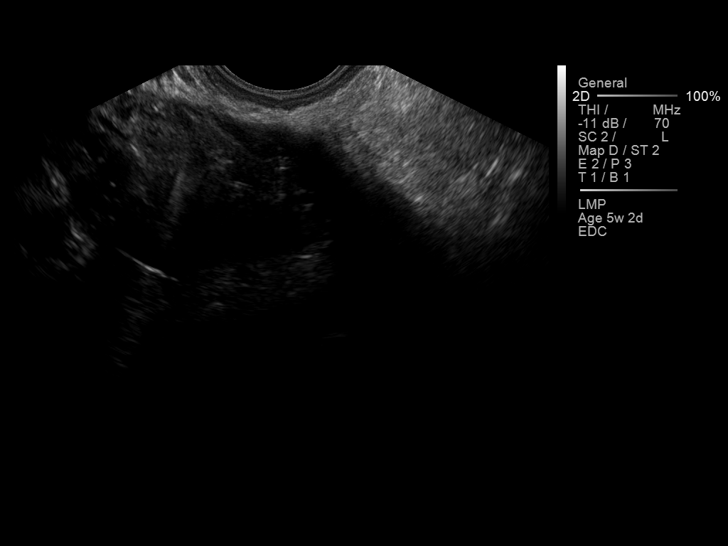
[im 34/57]
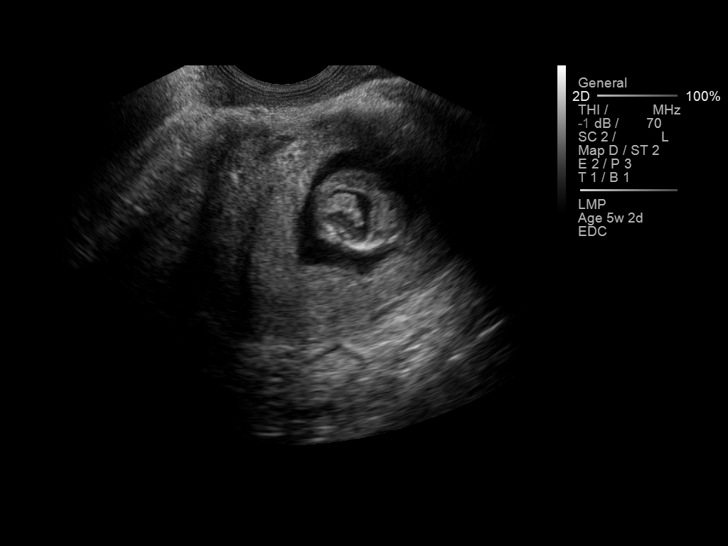
[im 38/57]
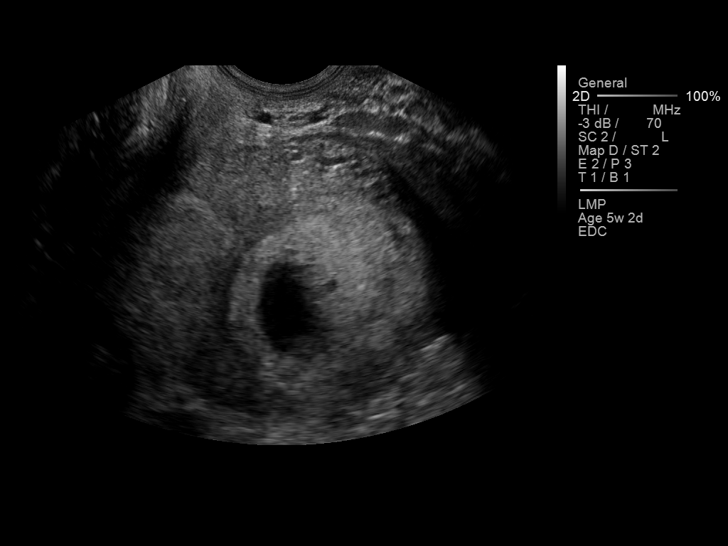
[im 42/57]
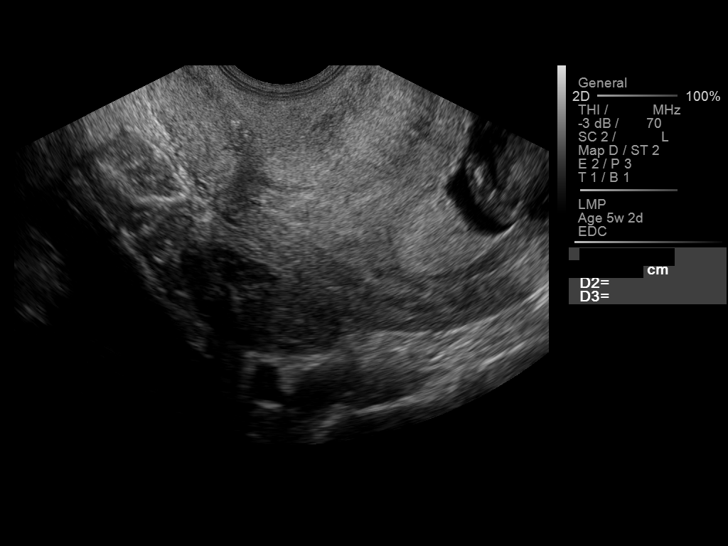
[im 46/57]
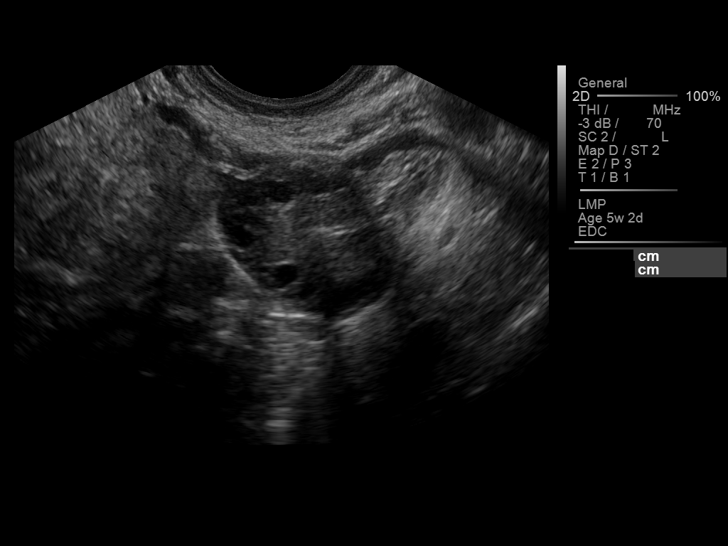
[im 50/57]
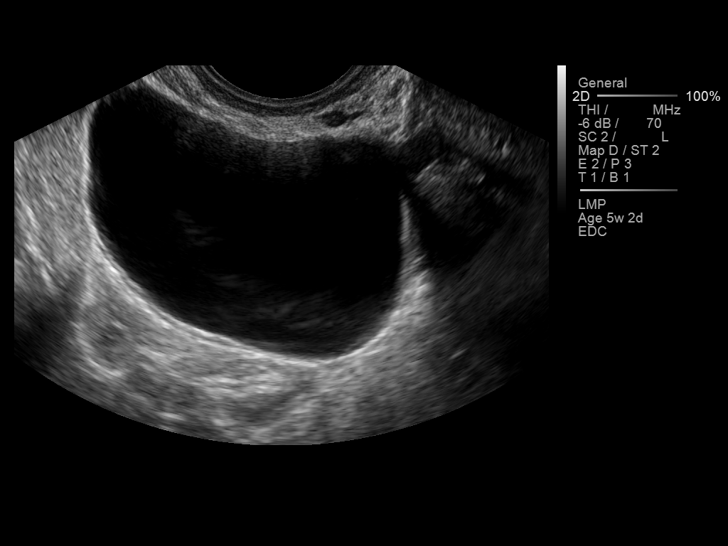
[im 54/57]
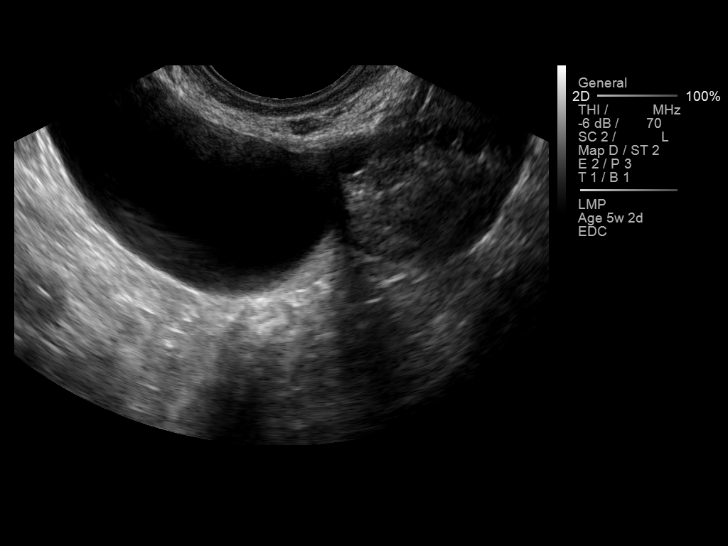

[13 of 28 positions shown; findings below may reference images not displayed]

FINDINGS: Intrauterine gestational sac: Visualized/normal in shape.

Yolk sac:  No

Embryo:  Yes

Cardiac Activity: Yes

Heart Rate: 153  bpm

CRL:  4.47 cm   11 w   2 d                  US EDC: 06/16/2015

Maternal uterus/adnexae: No subchorionic hemorrhage is noted.

The ovaries are unremarkable in appearance. The right ovary measures
3.5 x 2.2 x 2.3 cm, while the left ovary measures 3.1 x 2.3 x
cm. A 4.6 x 3.9 x 2.9 cm cyst is noted superior to the left ovary,
essentially anechoic in nature.

No free fluid is seen within the pelvic cul-de-sac.
IMPRESSION: 1. Single live intrauterine pregnancy noted, with a crown-rump
length measuring 4.5 cm, corresponding to a gestational age of 11
weeks 2 days. This does not match the gestational age by LMP, and
reflects a new estimated date of delivery June 16, 2015.
2. 4.6 cm simple cyst noted arising superior to the left ovary,
likely physiologic in nature.

## 2020-08-06 ENCOUNTER — Ambulatory Visit (HOSPITAL_COMMUNITY)
Admission: EM | Admit: 2020-08-06 | Discharge: 2020-08-06 | Disposition: A | Discharge status: Home / Self Care | Payer: Medicaid Other

## 2020-08-06 ENCOUNTER — Encounter (HOSPITAL_COMMUNITY): Payer: Self-pay | Admitting: Emergency Medicine

## 2020-08-06 ENCOUNTER — Other Ambulatory Visit: Payer: Self-pay

## 2020-08-06 DIAGNOSIS — J069 Acute upper respiratory infection, unspecified: Secondary | ICD-10-CM | POA: Diagnosis not present

## 2020-08-06 MED ORDER — IBUPROFEN 800 MG PO TABS: 800.0000 mg | ORAL_TABLET | Freq: Three times a day (TID) | ORAL | 0 refills | Status: DC | PRN

## 2020-08-06 MED ORDER — BENZONATATE 100 MG PO CAPS: 100.0000 mg | ORAL_CAPSULE | Freq: Three times a day (TID) | ORAL | 0 refills | Status: DC | PRN

## 2020-08-06 NOTE — Discharge Instructions (Signed)
Take the ibuprofen and Tessalon Perles as directed.    Follow up with your primary care provider if your symptoms are not improving.

## 2020-08-06 NOTE — ED Provider Notes (Signed)
[facility]    CSN: [id number] Arrival date & time: 08/06/20  0813      History   Chief Complaint Chief Complaint  Patient presents with  . Cough  . Sore Throat  . Nasal Congestion    HPI Peggy Gaines is a 35 y.o. female.   Patient presents with 3-day history of nasal congestion, sore throat, nonproductive cough.  She denies fever, chills, rash, shortness of breath, vomiting, diarrhea, or other symptoms.  OTC treatment attempted at home.  Her medical history includes obesity and migraine headaches.  She reports she is no longer breast-feeding.  The history is provided by the patient and medical records.    Past Medical History:  Diagnosis Date  . BMI 35.0-35.9,adult   . Migraine without aura   . Obesity   . Obesity affecting pregnancy in third trimester, antepartum 04/30/2015    Patient Active Problem List   Diagnosis Date Noted  . Breastfeeding (infant) 05/05/2015  . Postpartum care following vaginal delivery 04/30/2015  . BMI 38.0-38.9,adult 04/30/2015    Past Surgical History:  Procedure Laterality Date  . TONSILLECTOMY      OB History    Gravida  2   Para  1   Term      Preterm  1   AB  1   Living  1     SAB  1   IAB      Ectopic      Multiple  0   Live Births  1            Home Medications    Prior to Admission medications   Medication Sig Start Date End Date Taking? Authorizing Provider  benzonatate (TESSALON) 100 MG capsule Take 1 capsule (100 mg total) by mouth 3 (three) times daily as needed for cough. 08/06/20  Yes Mickie Bail, NP  ibuprofen (ADVIL) 800 MG tablet Take 1 tablet (800 mg total) by mouth every 8 (eight) hours as needed. 08/06/20  Yes Mickie Bail, NP  norethindrone (MICRONOR) 0.35 MG tablet Take 1 tablet by mouth daily.   Yes [provider]  benzocaine-Menthol (DERMOPLAST) 20-0.5 % AERO Apply 1 application topically as needed for irritation (perineal discomfort). 05/05/15   Farrel Conners, CNM  lanolin OINT Apply 1 application topically as needed (for breast care). 05/05/15   Farrel Conners, CNM  pantoprazole (PROTONIX) 40 MG tablet Take 40 mg by mouth daily.    [provider]  Prenatal Vit-Fe Fumarate-FA (PRENATAL MULTIVITAMIN) TABS tablet Take 1 tablet by mouth daily at 12 noon.    [provider]    Family History Family History  Problem Relation Age of Onset  . Hypertension Mother     Social History Social History   Tobacco Use  . Smoking status: Never Smoker  . Smokeless tobacco: Never Used  Substance Use Topics  . Alcohol use: No  . Drug use: No     Allergies   Patient has no known allergies.   Review of Systems Review of Systems  Constitutional: Negative for chills and fever.  HENT: Positive for congestion and sore throat. Negative for ear pain.   Eyes: Negative for pain and visual disturbance.  Respiratory: Positive for cough. Negative for shortness of breath.   Cardiovascular: Negative for chest pain and palpitations.  Gastrointestinal: Negative for abdominal pain, diarrhea and vomiting.  Genitourinary: Negative for dysuria and hematuria.  Musculoskeletal: Negative for arthralgias and back pain.  Skin: Negative for color change  and rash.  Neurological: Negative for seizures and syncope.  All other systems reviewed and are negative.    Physical Exam Triage Vital Signs ED Triage Vitals  Enc Vitals Group     BP      Pulse      Resp      Temp      Temp src      SpO2      Weight      Height      Head Circumference      Peak Flow      Pain Score      Pain Loc      Pain Edu?      Excl. in GC?    No data found.  Updated Vital Signs BP 123/86 (BP Location: Right Arm)   Pulse 74   Temp 98.4 F (36.9 C) (Oral)   Resp 20   Ht 5\' 7"  (1.702 m)   Wt (!) 310 lb (140.6 kg)   LMP 07/06/2020 (Approximate)   SpO2 98%   BMI 48.55 kg/m   Visual Acuity Right Eye Distance:   Left Eye Distance:   Bilateral  Distance:    Right Eye Near:   Left Eye Near:    Bilateral Near:     Physical Exam Vitals and nursing note reviewed.  Constitutional:      General: She is not in acute distress.    Appearance: She is well-developed and well-nourished. She is obese. She is not ill-appearing.  HENT:     Head: Normocephalic and atraumatic.     Right Ear: Tympanic membrane normal.     Left Ear: Tympanic membrane normal.     Nose: Nose normal.     Mouth/Throat:     Mouth: Mucous membranes are moist.     Pharynx: Oropharynx is clear.  Eyes:     Conjunctiva/sclera: Conjunctivae normal.  Cardiovascular:     Rate and Rhythm: Normal rate and regular rhythm.     Heart sounds: Normal heart sounds.  Pulmonary:     Effort: Pulmonary effort is normal. No respiratory distress.     Breath sounds: Normal breath sounds.  Abdominal:     Palpations: Abdomen is soft.     Tenderness: There is no abdominal tenderness.  Musculoskeletal:        General: No edema.     Cervical back: Neck supple.  Skin:    General: Skin is warm and dry.     Findings: No rash.  Neurological:     General: No focal deficit present.     Mental Status: She is alert and oriented to person, place, and time.     Gait: Gait normal.  Psychiatric:        Mood and Affect: Mood and affect and mood normal.        Behavior: Behavior normal.      UC Treatments / Results  Labs (all labs ordered are listed, but only abnormal results are displayed) Labs Reviewed - No data to display  EKG   Radiology No results found.  Procedures Procedures (including critical care time)  Medications Ordered in UC Medications - No data to display  Initial Impression / Assessment and Plan / UC Course  I have reviewed the triage vital signs and the nursing notes.  Pertinent labs & imaging results that were available during my care of the patient were reviewed by me and considered in my medical decision making (see chart for details).  Viral URI  with cough.  Patient declines COVID test today.  Treating with ibuprofen and Tessalon Perles.  Instructed patient to follow-up with her PCP if her symptoms are not improving.  She agrees to plan of care.      Final Clinical Impressions(s) / UC Diagnoses   Final diagnoses:  Viral URI with cough     Discharge Instructions     Take the ibuprofen and Tessalon Perles as directed.    Follow up with your primary care provider if your symptoms are not improving.       ED Prescriptions    Medication Sig Dispense Auth. Provider   ibuprofen (ADVIL) 800 MG tablet Take 1 tablet (800 mg total) by mouth every 8 (eight) hours as needed. 21 tablet Mickie Bail, NP   benzonatate (TESSALON) 100 MG capsule Take 1 capsule (100 mg total) by mouth 3 (three) times daily as needed for cough. 21 capsule Mickie Bail, NP     PDMP not reviewed this encounter.   Mickie Bail, NP 08/06/20 [phone]

## 2020-08-06 NOTE — ED Triage Notes (Signed)
Pt presents today with c/o of sore throat, cough and nasal congestion x 3 days. Denies fever.

## 2023-06-07 NOTE — L&D Delivery Note (Signed)
 OB/GYN Faculty Practice Delivery Note  Peggy Gaines is a 38 y.o. [id number] s/p SVD at [redacted]w[redacted]d. She was admitted for IOL for AMA and BMI 47.   ROM: 8h 22m with clear fluid GBS Status:  Negative/-- (04/07 1643) Maximum Maternal Temperature: 98.94F  Labor Progress: Initial SVE: closed/thick/high. Ripened with dual cytotec  and foley balloon. Augmented with AROM and Pitocin . Epidural. She then progressed to complete @0515 .   Delivery Date/Time: 10/06/2023 @0534   Delivery: Called to room and patient was complete and pushing. Head delivered LOA. No nuchal cord present. Two true umbilical cord knots! Shoulder and body delivered in usual fashion. Infant "Liam" with spontaneous cry, placed on mother's abdomen, dried and stimulated. Cord clamped x 2 after 1-minute delay, and cut by FOB. Cord blood drawn. Placenta delivered spontaneously with gentle cord traction. Fundus firm with massage and Pitocin . Labia, perineum, vagina, and cervix inspected with bilateral nonparallel hemostatic labial abrasions.   Mirena  IUD placed manually to uterine fundus, strings left long and tucked into vaginal vault to be trimmed at post partum visit.   Baby Weight: pending  Placenta: 3 vessel, intact. Sent to L&D Complications: None Lacerations: None EBL: 204 mL Analgesia: Epidural   Infant:  APGAR (1 MIN): 8  APGAR (5 MINS): 9   [place], MD OB Family Medicine Fellow, [facility] for [facility], [facility] 10/06/2023, 6:01 AM

## 2023-06-24 ENCOUNTER — Inpatient Hospital Stay (HOSPITAL_COMMUNITY): Payer: Medicaid Other

## 2023-06-24 ENCOUNTER — Encounter (HOSPITAL_COMMUNITY): Payer: Self-pay

## 2023-06-24 ENCOUNTER — Inpatient Hospital Stay (HOSPITAL_COMMUNITY)
Admission: AD | Admit: 2023-06-24 | Discharge: 2023-06-24 | Disposition: A | Discharge status: Home / Self Care | Payer: Medicaid Other | Attending: Obstetrics & Gynecology | Admitting: Obstetrics & Gynecology

## 2023-06-24 DIAGNOSIS — A5602 Chlamydial vulvovaginitis: Secondary | ICD-10-CM | POA: Diagnosis not present

## 2023-06-24 DIAGNOSIS — R109 Unspecified abdominal pain: Secondary | ICD-10-CM | POA: Diagnosis present

## 2023-06-24 DIAGNOSIS — B9689 Other specified bacterial agents as the cause of diseases classified elsewhere: Secondary | ICD-10-CM

## 2023-06-24 DIAGNOSIS — Z3A25 25 weeks gestation of pregnancy: Secondary | ICD-10-CM

## 2023-06-24 DIAGNOSIS — N760 Acute vaginitis: Secondary | ICD-10-CM | POA: Diagnosis not present

## 2023-06-24 DIAGNOSIS — O23592 Infection of other part of genital tract in pregnancy, second trimester: Secondary | ICD-10-CM | POA: Insufficient documentation

## 2023-06-24 DIAGNOSIS — R102 Pelvic and perineal pain: Secondary | ICD-10-CM | POA: Insufficient documentation

## 2023-06-24 DIAGNOSIS — O26892 Other specified pregnancy related conditions, second trimester: Secondary | ICD-10-CM

## 2023-06-24 DIAGNOSIS — O0932 Supervision of pregnancy with insufficient antenatal care, second trimester: Secondary | ICD-10-CM | POA: Insufficient documentation

## 2023-06-24 DIAGNOSIS — Z3687 Encounter for antenatal screening for uncertain dates: Secondary | ICD-10-CM | POA: Diagnosis not present

## 2023-06-24 DIAGNOSIS — Z363 Encounter for antenatal screening for malformations: Secondary | ICD-10-CM | POA: Insufficient documentation

## 2023-06-24 DIAGNOSIS — O98312 Other infections with a predominantly sexual mode of transmission complicating pregnancy, second trimester: Secondary | ICD-10-CM | POA: Insufficient documentation

## 2023-06-24 DIAGNOSIS — Z3A26 26 weeks gestation of pregnancy: Secondary | ICD-10-CM | POA: Diagnosis not present

## 2023-06-24 LAB — URINALYSIS, ROUTINE W REFLEX MICROSCOPIC
Bilirubin Urine: NEGATIVE
Glucose, UA: NEGATIVE mg/dL
Ketones, ur: NEGATIVE mg/dL
Nitrite: NEGATIVE
Protein, ur: NEGATIVE mg/dL
Specific Gravity, Urine: 1.01 (ref 1.005–1.030)
pH: 6.5 (ref 5.0–8.0)

## 2023-06-24 LAB — CBC
HCT: 35.2 % — ABNORMAL LOW (ref 36.0–46.0)
Hemoglobin: 11.5 g/dL — ABNORMAL LOW (ref 12.0–15.0)
MCH: 27.8 pg (ref 26.0–34.0)
MCHC: 32.7 g/dL (ref 30.0–36.0)
MCV: 85 fL (ref 80.0–100.0)
Platelets: 304 10*3/uL (ref 150–400)
RBC: 4.14 MIL/uL (ref 3.87–5.11)
RDW: 14.9 % (ref 11.5–15.5)
WBC: 8.9 10*3/uL (ref 4.0–10.5)
nRBC: 0 % (ref 0.0–0.2)

## 2023-06-24 LAB — WET PREP, GENITAL
Sperm: NONE SEEN
Trich, Wet Prep: NONE SEEN
WBC, Wet Prep HPF POC: >=10 — AB (ref <10)
Yeast Wet Prep HPF POC: NONE SEEN

## 2023-06-24 LAB — COMPREHENSIVE METABOLIC PANEL
ALT: 13 U/L (ref 0–44)
AST: 14 U/L — ABNORMAL LOW (ref 15–41)
Albumin: 2.9 g/dL — ABNORMAL LOW (ref 3.5–5.0)
Alkaline Phosphatase: 70 U/L (ref 38–126)
Anion gap: 10 (ref 5–15)
BUN: 8 mg/dL (ref 6–20)
CO2: 20 mmol/L — ABNORMAL LOW (ref 22–32)
Calcium: 9 mg/dL (ref 8.9–10.3)
Chloride: 107 mmol/L (ref 98–111)
Creatinine, Ser: 0.64 mg/dL (ref 0.44–1.00)
GFR, Estimated: >60 mL/min (ref >60)
Glucose, Bld: 84 mg/dL (ref 70–99)
Potassium: 4 mmol/L (ref 3.5–5.1)
Sodium: 137 mmol/L (ref 135–145)
Total Bilirubin: 0.6 mg/dL (ref 0.0–1.2)
Total Protein: 6.5 g/dL (ref 6.5–8.1)

## 2023-06-24 LAB — URINALYSIS, MICROSCOPIC (REFLEX)

## 2023-06-24 LAB — POCT PREGNANCY, URINE
Preg Test, Ur: POSITIVE — AB
Preg Test, Ur: POSITIVE — AB

## 2023-06-24 LAB — ABO/RH: ABO/RH(D): A POS

## 2023-06-24 LAB — HCG, QUANTITATIVE, PREGNANCY: hCG, Beta Chain, Quant, S: 3132 m[IU]/mL — ABNORMAL HIGH (ref <5)

## 2023-06-24 MED ORDER — METRONIDAZOLE 500 MG PO TABS: 500.0000 mg | ORAL_TABLET | Freq: Two times a day (BID) | ORAL | 0 refills | Status: DC

## 2023-06-24 MED ORDER — PREPLUS 27-1 MG PO TABS: 1.0000 | ORAL_TABLET | Freq: Every day | ORAL | 3 refills | Status: AC

## 2023-06-24 NOTE — MAU Provider Note (Cosign Needed Addendum)
Chief Complaint:  Abdominal Pain (LUQ pain)   HPI    Peggy Gaines is a 38 y.o. [id number] at Unknown who presents to maternity admissions reporting abdominal pain, mainly on the right side. No vaginal bleeding. States, " I feel like I may be further along."   Pregnancy Course: Unknown  Past Medical History:  Diagnosis Date   BMI 35.0-35.9,adult    Migraine without aura    Obesity    Obesity affecting pregnancy in third trimester, antepartum 04/30/2015   OB History  Gravida Para Term Preterm AB Living  3 1  1 1 1   SAB IAB Ectopic Multiple Live Births  1   0 1    # Outcome Date GA Lbr Len/2nd Weight Sex Type Anes PTL Lv  3 Current           2 Preterm 05/03/15 [redacted]w[redacted]d 13:25 / 00:15 1990 g F Vag-Spont EPI  LIV  1 SAB            Past Surgical History:  Procedure Laterality Date   TONSILLECTOMY     Family History  Problem Relation Age of Onset   Hypertension Mother    Social History   Tobacco Use   Smoking status: Never   Smokeless tobacco: Never  Substance Use Topics   Alcohol use: No   Drug use: No   No Known Allergies Medications Prior to Admission  Medication Sig Dispense Refill Last Dose/Taking   benzocaine-Menthol (DERMOPLAST) 20-0.5 % AERO Apply 1 application topically as needed for irritation (perineal discomfort).      benzonatate (TESSALON) 100 MG capsule Take 1 capsule (100 mg total) by mouth 3 (three) times daily as needed for cough. 21 capsule 0    ibuprofen (ADVIL) 800 MG tablet Take 1 tablet (800 mg total) by mouth every 8 (eight) hours as needed. 21 tablet 0    lanolin OINT Apply 1 application topically as needed (for breast care).  0    norethindrone (MICRONOR) 0.35 MG tablet Take 1 tablet by mouth daily.      pantoprazole (PROTONIX) 40 MG tablet Take 40 mg by mouth daily.      Prenatal Vit-Fe Fumarate-FA (PRENATAL MULTIVITAMIN) TABS tablet Take 1 tablet by mouth daily at 12 noon.       I have reviewed patient's Past Medical Hx, Surgical Hx, Family  Hx, Social Hx, medications and allergies.   ROS  Pertinent items noted in HPI and remainder of comprehensive ROS otherwise negative.   PHYSICAL EXAM  Patient Vitals for the past 24 hrs:  BP Temp Temp src Pulse Resp SpO2 Height Weight  06/24/23 1804 134/71 98 F (36.7 C) Oral 79 16 100 % 5\' 7"  (1.702 m) 126.6 kg    Constitutional: Well-developed, well-nourished female in no acute distress  Cardiovascular: normal rate & rhythm, warm and well-perfused Respiratory: normal effort, no problems with respiration noted GI: Abd soft, non-tender, non-distended MS: Extremities nontender, no edema, normal ROM Neurologic: Alert and oriented x 4.  GU: no CVA tenderness Pelvic: NEFG, physiologic discharge, no blood, cervix clean     Fetal Tracing: Baseline: 155 Variability: moderate Accelerations: 10x10 Decelerations: absent Toco: none   Labs: Results for orders placed or performed during the hospital encounter of 06/24/23 (from the past 24 hours)  Pregnancy, urine POC     Status: Abnormal   Collection Time: 06/24/23  5:57 PM  Result Value Ref Range   Preg Test, Ur POSITIVE (A) NEGATIVE  Pregnancy, urine POC     Status:  Abnormal   Collection Time: 06/24/23  6:08 PM  Result Value Ref Range   Preg Test, Ur POSITIVE (A) NEGATIVE  Urinalysis, Routine w reflex microscopic -Urine, Clean Catch     Status: Abnormal   Collection Time: 06/24/23  6:19 PM  Result Value Ref Range   Color, Urine YELLOW YELLOW   APPearance CLEAR CLEAR   Specific Gravity, Urine 1.010 1.005 - 1.030   pH 6.5 5.0 - 8.0   Glucose, UA NEGATIVE NEGATIVE mg/dL   Hgb urine dipstick SMALL (A) NEGATIVE   Bilirubin Urine NEGATIVE NEGATIVE   Ketones, ur NEGATIVE NEGATIVE mg/dL   Protein, ur NEGATIVE NEGATIVE mg/dL   Nitrite NEGATIVE NEGATIVE   Leukocytes,Ua MODERATE (A) NEGATIVE  Urinalysis, Microscopic (reflex)     Status: Abnormal   Collection Time: 06/24/23  6:19 PM  Result Value Ref Range   RBC / HPF 0-5 0 - 5  RBC/hpf   WBC, UA 0-5 0 - 5 WBC/hpf   Bacteria, UA FEW (A) NONE SEEN   Squamous Epithelial / HPF 6-10 0 - 5 /HPF  Wet prep, genital     Status: Abnormal   Collection Time: 06/24/23  6:23 PM   Specimen: PATH Cytology Cervicovaginal Ancillary Only  Result Value Ref Range   Yeast Wet Prep HPF POC NONE SEEN NONE SEEN   Trich, Wet Prep NONE SEEN NONE SEEN   Clue Cells Wet Prep HPF POC PRESENT (A) NONE SEEN   WBC, Wet Prep HPF POC >=10 (A) <10   Sperm NONE SEEN   ABO/Rh     Status: None   Collection Time: 06/24/23  6:35 PM  Result Value Ref Range   ABO/RH(D) A POS    No rh immune globuloin      NOT A RH IMMUNE GLOBULIN CANDIDATE, PT RH POSITIVE Performed at [facility], [address] [place], [place] [address]   CBC     Status: Abnormal   Collection Time: 06/24/23  6:37 PM  Result Value Ref Range   WBC 8.9 4.0 - 10.5 K/uL   RBC 4.14 3.87 - 5.11 MIL/uL   Hemoglobin 11.5 (L) 12.0 - 15.0 g/dL   HCT 78.4 (L) 69.6 - 29.5 %   MCV 85.0 80.0 - 100.0 fL   MCH 27.8 26.0 - 34.0 pg   MCHC 32.7 30.0 - 36.0 g/dL   RDW 28.4 13.2 - 44.0 %   Platelets 304 150 - 400 K/uL   nRBC 0.0 0.0 - 0.2 %  hCG, quantitative, pregnancy     Status: Abnormal   Collection Time: 06/24/23  6:37 PM  Result Value Ref Range   hCG, Beta Chain, Quant, S 3,132 (H) <5 mIU/mL  Comprehensive metabolic panel     Status: Abnormal   Collection Time: 06/24/23  6:37 PM  Result Value Ref Range   Sodium 137 135 - 145 mmol/L   Potassium 4.0 3.5 - 5.1 mmol/L   Chloride 107 98 - 111 mmol/L   CO2 20 (L) 22 - 32 mmol/L   Glucose, Bld 84 70 - 99 mg/dL   BUN 8 6 - 20 mg/dL   Creatinine, Ser 1.02 0.44 - 1.00 mg/dL   Calcium 9.0 8.9 - 72.5 mg/dL   Total Protein 6.5 6.5 - 8.1 g/dL   Albumin 2.9 (L) 3.5 - 5.0 g/dL   AST 14 (L) 15 - 41 U/L   ALT 13 0 - 44 U/L   Alkaline Phosphatase 70 38 - 126 U/L   Total Bilirubin  0.6 0.0 - 1.2 mg/dL   GFR, Estimated >13 >08 mL/min   Anion gap 10 5 - 15    Imaging:  No results  found.  MDM & MAU COURSE  MDM:  MAU Course: Orders Placed This Encounter  Procedures   Wet prep, genital   [place] MFM OB COMP + 14 WK   CBC   hCG, quantitative, pregnancy   Comprehensive metabolic panel   Urinalysis, Routine w reflex microscopic -Urine, Clean Catch   Urinalysis, Microscopic (reflex)   Diet NPO time specified   Pregnancy, urine POC   Pregnancy, urine POC   ABO/Rh   Meds ordered this encounter  Medications   metroNIDAZOLE (FLAGYL) 500 MG tablet    Sig: Take 1 tablet (500 mg total) by mouth 2 (two) times daily.    Dispense:  14 tablet    Refill:  0    ASSESSMENT   1. Bacterial vaginosis   2. [redacted] weeks gestation of pregnancy   3. Limited prenatal care in second trimester     PLAN   Limited HPI before she was taken to [place].  Hand off of care to Dr. Earlene Plater.  Lamont Snowball, CNM   Reassessed patient 2224: Patient with unknown dating and very irregular periods. Ultrasound consistent with 25+[redacted] weeks gestation of pregnancy. NST reactive for gestation. Sent in PNV. Discussed BV infection and treatment with flagyl. Patient plans to establish care with [facility]. Discussed 2nd trimester expectations.  Joanne Gavel, MD

## 2023-06-24 NOTE — MAU Note (Signed)
..  Peggy Gaines is a 38 y.o. at Unknown here in MAU reporting: left sided abdominal pain that started around 1600 this afternoon. Rates pain at an 8/10 and states it is intermittent, throbbing and sometimes stabbing. Patient took a home pregnancy test a couple of days ago which was positive. UPC in triage also positive. Pt states she believes her last menstrual period was around the beginning of October but doesn't have an exact date due to recent stress. Denies vaginal bleeding, discharge. Reports some increased frequency of urination. Denies burning with urination.   Pain score: 8/10 Vitals:   06/24/23 1804  BP: 134/71  Pulse: 79  Resp: 16  Temp: 98 F (36.7 C)  SpO2: 100%     FHT: n/a Lab orders placed from triage:  UA, UPC

## 2023-06-26 ENCOUNTER — Other Ambulatory Visit (HOSPITAL_COMMUNITY): Payer: Self-pay | Admitting: Certified Nurse Midwife

## 2023-06-26 DIAGNOSIS — A749 Chlamydial infection, unspecified: Secondary | ICD-10-CM

## 2023-06-26 LAB — GC/CHLAMYDIA PROBE AMP (~~LOC~~) NOT AT ARMC
Chlamydia: POSITIVE — AB
Comment: NEGATIVE
Comment: NORMAL
Neisseria Gonorrhea: NEGATIVE

## 2023-06-26 MED ORDER — AZITHROMYCIN 500 MG PO TABS: 1000.0000 mg | ORAL_TABLET | Freq: Once | ORAL | 0 refills | Status: AC

## 2023-06-26 NOTE — Progress Notes (Signed)
Patient seen in MAU with positive chlamydia result. Attempted to call patient without response. Please let her know her partner(s) will need treatment and I sent a prescription for her.  Thank you, Huntley Dec

## 2023-06-28 ENCOUNTER — Telehealth: Payer: Self-pay | Admitting: Lactation Services

## 2023-06-28 NOTE — Telephone Encounter (Signed)
-----   Message from Lovecchio Landry sent at 06/26/2023  2:11 PM EST ----- Patient seen in MAU with positive chlamydia result. Attempted to call patient without response. Please let her know her partner(s) will need treatment and I sent a prescription for her.  Thank you, Huntley Dec

## 2023-06-28 NOTE — Telephone Encounter (Signed)
Attempted to call patient, she did not answer and received message that call cannot be completed at this time. Letter created to send to patient today.   STD report sent to [facility], fax confirmation received.

## 2023-07-24 ENCOUNTER — Encounter: Payer: Self-pay | Admitting: Obstetrics & Gynecology

## 2023-07-24 ENCOUNTER — Other Ambulatory Visit (HOSPITAL_COMMUNITY)
Admission: RE | Admit: 2023-07-24 | Discharge: 2023-07-24 | Disposition: A | Discharge status: Home / Self Care | Payer: Medicaid Other | Source: Ambulatory Visit | Attending: Obstetrics & Gynecology | Admitting: Obstetrics & Gynecology

## 2023-07-24 ENCOUNTER — Other Ambulatory Visit: Payer: Self-pay

## 2023-07-24 ENCOUNTER — Ambulatory Visit (INDEPENDENT_AMBULATORY_CARE_PROVIDER_SITE_OTHER): Payer: Medicaid Other | Admitting: Obstetrics & Gynecology

## 2023-07-24 VITALS — BP 135/83 | HR 94 | Wt 281.0 lb

## 2023-07-24 DIAGNOSIS — Z3A29 29 weeks gestation of pregnancy: Secondary | ICD-10-CM | POA: Diagnosis not present

## 2023-07-24 DIAGNOSIS — O0993 Supervision of high risk pregnancy, unspecified, third trimester: Secondary | ICD-10-CM | POA: Diagnosis present

## 2023-07-24 DIAGNOSIS — O0930 Supervision of pregnancy with insufficient antenatal care, unspecified trimester: Secondary | ICD-10-CM | POA: Diagnosis not present

## 2023-07-24 DIAGNOSIS — Z1332 Encounter for screening for maternal depression: Secondary | ICD-10-CM | POA: Diagnosis not present

## 2023-07-24 DIAGNOSIS — O0990 Supervision of high risk pregnancy, unspecified, unspecified trimester: Secondary | ICD-10-CM | POA: Insufficient documentation

## 2023-07-24 NOTE — Progress Notes (Signed)
  Subjective:late to care    Peggy Gaines is a [id number] [redacted]w[redacted]d being seen today for her first obstetrical visit.  Her obstetrical history is significant for advanced maternal age and obesity. Patient does intend to breast feed. Pregnancy history fully reviewed.  Patient reports no complaints.  Vitals:   07/24/23 1458  BP: 135/83  Pulse: 94  Weight: 281 lb (127.5 kg)    HISTORY: OB History  Gravida Para Term Preterm AB Living  4 2  2 1 2   SAB IAB Ectopic Multiple Live Births  1   0 2    # Outcome Date GA Lbr Len/2nd Weight Sex Type Anes PTL Lv  4 Current           3 Preterm 05/03/15 [redacted]w[redacted]d 13:25 / 00:15 4 lb 6.2 oz (1.99 kg) F Vag-Spont EPI  LIV  2 Preterm 05/03/15 [redacted]w[redacted]d   F Vag-Spont EPI  LIV  1 SAB 2014           Past Medical History:  Diagnosis Date   BMI 35.0-35.9,adult    BMI 38.0-38.9,adult 04/30/2015   Breastfeeding (infant) 05/05/2015   Migraine without aura    Obesity    Obesity affecting pregnancy in third trimester, antepartum 04/30/2015   Postpartum care following vaginal delivery 04/30/2015   Past Surgical History:  Procedure Laterality Date   TONSILLECTOMY     Family History  Problem Relation Age of Onset   Hypertension Mother      Exam    Uterus:   29 cm  Pelvic Exam: Requests deferred   Perineum:    Vulva:    Vagina:     pH:    Cervix:    Adnexa:    Bony Pelvis:   System: Breast:  Inspection negative   Skin: normal coloration and turgor, no rashes    Neurologic: oriented, normal mood   Extremities: normal strength, tone, and muscle mass   HEENT PERRLA, sclera clear, anicteric, neck supple with midline trachea, and thyroid without masses   Mouth/Teeth mucous membranes moist, pharynx normal without lesions and dental hygiene good   Neck supple and no masses   Cardiovascular: regular rate and rhythm, no murmurs or gallops   Respiratory:  appears well, vitals normal, no respiratory distress, acyanotic, normal RR, chest clear, no  wheezing, crepitations, rhonchi, normal symmetric air entry   Abdomen: obese   Urinary:       Assessment:    Pregnancy: [id number] Patient Active Problem List   Diagnosis Date Noted   Pregnancy, supervision, high-risk 07/24/2023        Plan:     Initial labs drawn. Prenatal vitamins. Problem list reviewed and updated. Genetic Screening discussed : ordered.  Ultrasound discussed; fetal survey: ordered.  Follow up in 2 weeks. 50% of 30 min visit spent on counseling and coordination of care.     Scheryl Darter 07/24/2023

## 2023-07-25 LAB — CBC/D/PLT+RPR+RH+ABO+RUBIGG...
Antibody Screen: NEGATIVE
Basophils Absolute: 0 10*3/uL (ref 0.0–0.2)
Basos: 0 %
EOS (ABSOLUTE): 0 10*3/uL (ref 0.0–0.4)
Eos: 1 %
HCV Ab: NONREACTIVE
HIV Screen 4th Generation wRfx: NONREACTIVE
Hematocrit: 34.2 % (ref 34.0–46.6)
Hemoglobin: 11.4 g/dL (ref 11.1–15.9)
Hepatitis B Surface Ag: NEGATIVE
Immature Grans (Abs): 0 10*3/uL (ref 0.0–0.1)
Immature Granulocytes: 0 %
Lymphocytes Absolute: 1.1 10*3/uL (ref 0.7–3.1)
Lymphs: 16 %
MCH: 29.1 pg (ref 26.6–33.0)
MCHC: 33.3 g/dL (ref 31.5–35.7)
MCV: 87 fL (ref 79–97)
Monocytes Absolute: 0.4 10*3/uL (ref 0.1–0.9)
Monocytes: 6 %
Neutrophils Absolute: 5.3 10*3/uL (ref 1.4–7.0)
Neutrophils: 77 %
Platelets: 286 10*3/uL (ref 150–450)
RBC: 3.92 x10E6/uL (ref 3.77–5.28)
RDW: 16.5 % — ABNORMAL HIGH (ref 11.7–15.4)
RPR Ser Ql: NONREACTIVE
Rh Factor: POSITIVE
Rubella Antibodies, IGG: 1.24 {index} (ref >0.99)
WBC: 6.9 10*3/uL (ref 3.4–10.8)

## 2023-07-25 LAB — HEMOGLOBIN A1C
Est. average glucose Bld gHb Est-mCnc: 108 mg/dL
Hgb A1c MFr Bld: 5.4 % (ref 4.8–5.6)

## 2023-07-25 LAB — CERVICOVAGINAL ANCILLARY ONLY
Chlamydia: NEGATIVE
Comment: NEGATIVE
Comment: NORMAL
Neisseria Gonorrhea: NEGATIVE

## 2023-07-25 LAB — HCV INTERPRETATION

## 2023-07-27 LAB — URINE CULTURE, OB REFLEX

## 2023-07-27 LAB — CULTURE, OB URINE

## 2023-08-06 NOTE — Progress Notes (Unsigned)
   PRENATAL VISIT NOTE  Subjective:  Peggy Gaines is a 38 y.o. [id number] at [redacted]w[redacted]d being seen today for ongoing prenatal care.  She is currently monitored for the following issues for this high-risk pregnancy and has Pregnancy, supervision, high-risk on their problem list.  Patient reports no complaints.  Contractions: Irritability. Vag. Bleeding: None.  Movement: Present. Denies leaking of fluid.   The following portions of the patient's history were reviewed and updated as appropriate: allergies, current medications, past family history, past medical history, past social history, past surgical history and problem list.   Objective:   Vitals:   08/07/23 0824 08/07/23 0840  BP: (!) 148/87 122/78  Pulse: 89 85  Weight: 282 lb (127.9 kg)     Fetal Status: Fetal Heart Rate (bpm): 140 Fundal Height: 32 cm Movement: Present     General:  Alert, oriented and cooperative. Patient is in no acute distress.  Skin: Skin is warm and dry. No rash noted.   Cardiovascular: Normal heart rate noted  Respiratory: Normal respiratory effort, no problems with respiration noted  Abdomen: Soft, gravid, appropriate for gestational age.  Pain/Pressure: Absent     Pelvic: Cervical exam deferred        Extremities: Normal range of motion.  Edema: None  Mental Status: Normal mood and affect. Normal behavior. Normal judgment and thought content.   Assessment and Plan:  Pregnancy: [id number] at [redacted]w[redacted]d 1. Supervision of high risk pregnancy in third trimester (Primary) BP and FHR normal Doing well, feeling regular movement    2. Late prenatal care affecting pregnancy, antepartum   3. Asymptomatic bacteriuria during pregnancy Discussed, will send tx today  4. [redacted] weeks gestation of pregnancy Planning POPs for birth control Anticipatory guidance regarding GTT and labs this thursday, discussed NPO status after midnight  TDAP given today   5. Multigravida of advanced maternal age in third  trimester Scheduling u/s follow up today  Mild fetal pyelectasis   Preterm labor symptoms and general obstetric precautions including but not limited to vaginal bleeding, contractions, leaking of fluid and fetal movement were reviewed in detail with the patient. Please refer to After Visit Summary for other counseling recommendations.   Return in about 2 weeks (around 08/21/2023) for OB VISIT (MD or APP). Future Appointments  Date Time Provider Department Center  08/10/2023  8:20 AM [facility]  08/21/2023  7:30 AM [facility] US3 [facility]  08/21/2023  3:55 PM Sue Lush, FNP [facility]     Albertine Grates, FNP

## 2023-08-07 ENCOUNTER — Ambulatory Visit (INDEPENDENT_AMBULATORY_CARE_PROVIDER_SITE_OTHER): Payer: Medicaid Other | Admitting: Obstetrics and Gynecology

## 2023-08-07 VITALS — BP 122/78 | HR 85 | Wt 282.0 lb

## 2023-08-07 DIAGNOSIS — O99891 Other specified diseases and conditions complicating pregnancy: Secondary | ICD-10-CM

## 2023-08-07 DIAGNOSIS — Z23 Encounter for immunization: Secondary | ICD-10-CM | POA: Diagnosis not present

## 2023-08-07 DIAGNOSIS — R8271 Bacteriuria: Secondary | ICD-10-CM

## 2023-08-07 DIAGNOSIS — Z3A31 31 weeks gestation of pregnancy: Secondary | ICD-10-CM

## 2023-08-07 DIAGNOSIS — O0930 Supervision of pregnancy with insufficient antenatal care, unspecified trimester: Secondary | ICD-10-CM

## 2023-08-07 DIAGNOSIS — O0993 Supervision of high risk pregnancy, unspecified, third trimester: Secondary | ICD-10-CM

## 2023-08-07 DIAGNOSIS — O0933 Supervision of pregnancy with insufficient antenatal care, third trimester: Secondary | ICD-10-CM

## 2023-08-07 DIAGNOSIS — O09523 Supervision of elderly multigravida, third trimester: Secondary | ICD-10-CM

## 2023-08-07 MED ORDER — CEFADROXIL 500 MG PO CAPS
500.0000 mg | ORAL_CAPSULE | Freq: Two times a day (BID) | ORAL | 0 refills | Status: AC
Start: 2023-08-07 — End: 2023-08-14

## 2023-08-07 NOTE — Addendum Note (Signed)
 Addended byQuintella Reichert on: 08/07/2023 08:58 AM   Modules accepted: Orders

## 2023-08-09 ENCOUNTER — Other Ambulatory Visit

## 2023-08-10 ENCOUNTER — Other Ambulatory Visit: Payer: Self-pay

## 2023-08-10 ENCOUNTER — Other Ambulatory Visit

## 2023-08-10 DIAGNOSIS — O0993 Supervision of high risk pregnancy, unspecified, third trimester: Secondary | ICD-10-CM

## 2023-08-11 LAB — GLUCOSE TOLERANCE, 2 HOURS W/ 1HR
Glucose, 1 hour: 119 mg/dL (ref 70–179)
Glucose, 2 hour: 113 mg/dL (ref 70–152)
Glucose, Fasting: 91 mg/dL (ref 70–91)

## 2023-08-18 DIAGNOSIS — O09529 Supervision of elderly multigravida, unspecified trimester: Secondary | ICD-10-CM | POA: Insufficient documentation

## 2023-08-18 DIAGNOSIS — O99210 Obesity complicating pregnancy, unspecified trimester: Secondary | ICD-10-CM | POA: Insufficient documentation

## 2023-08-18 DIAGNOSIS — O35EXX0 Maternal care for other (suspected) fetal abnormality and damage, fetal genitourinary anomalies, not applicable or unspecified: Secondary | ICD-10-CM | POA: Insufficient documentation

## 2023-08-21 ENCOUNTER — Ambulatory Visit: Admitting: Obstetrics and Gynecology

## 2023-08-21 ENCOUNTER — Other Ambulatory Visit: Payer: Self-pay | Admitting: Obstetrics & Gynecology

## 2023-08-21 ENCOUNTER — Other Ambulatory Visit: Payer: Self-pay

## 2023-08-21 ENCOUNTER — Ambulatory Visit: Admitting: Maternal & Fetal Medicine

## 2023-08-21 ENCOUNTER — Ambulatory Visit: Attending: Obstetrics & Gynecology

## 2023-08-21 VITALS — BP 125/69 | HR 93 | Wt 280.0 lb

## 2023-08-21 DIAGNOSIS — O99210 Obesity complicating pregnancy, unspecified trimester: Secondary | ICD-10-CM | POA: Diagnosis present

## 2023-08-21 DIAGNOSIS — O0993 Supervision of high risk pregnancy, unspecified, third trimester: Secondary | ICD-10-CM | POA: Diagnosis present

## 2023-08-21 DIAGNOSIS — E669 Obesity, unspecified: Secondary | ICD-10-CM

## 2023-08-21 DIAGNOSIS — Z3A29 29 weeks gestation of pregnancy: Secondary | ICD-10-CM

## 2023-08-21 DIAGNOSIS — O0930 Supervision of pregnancy with insufficient antenatal care, unspecified trimester: Secondary | ICD-10-CM

## 2023-08-21 DIAGNOSIS — O35EXX0 Maternal care for other (suspected) fetal abnormality and damage, fetal genitourinary anomalies, not applicable or unspecified: Secondary | ICD-10-CM | POA: Diagnosis present

## 2023-08-21 DIAGNOSIS — O359XX0 Maternal care for (suspected) fetal abnormality and damage, unspecified, not applicable or unspecified: Secondary | ICD-10-CM | POA: Diagnosis present

## 2023-08-21 DIAGNOSIS — O99213 Obesity complicating pregnancy, third trimester: Secondary | ICD-10-CM

## 2023-08-21 DIAGNOSIS — O09523 Supervision of elderly multigravida, third trimester: Secondary | ICD-10-CM | POA: Diagnosis not present

## 2023-08-21 DIAGNOSIS — Z3A33 33 weeks gestation of pregnancy: Secondary | ICD-10-CM | POA: Insufficient documentation

## 2023-08-21 NOTE — Progress Notes (Signed)
   PRENATAL VISIT NOTE  Subjective:  Peggy Gaines is a 38 y.o. [id number] at [redacted]w[redacted]d being seen today for ongoing prenatal care.  She is currently monitored for the following issues for this low-risk pregnancy and has Pregnancy, supervision, high-risk; Obesity affecting pregnancy, antepartum; Encounter for repeat ultrasound of fetal pyelectasis in singleton pregnancy, antepartum; AMA (advanced maternal age) multigravida 35+; and Known fetal anomaly, antepartum (renal UTD) on their problem list.  Patient reports no complaints.  Contractions: Not present. Vag. Bleeding: None.  Movement: Present. Denies leaking of fluid.   The following portions of the patient's history were reviewed and updated as appropriate: allergies, current medications, past family history, past medical history, past social history, past surgical history and problem list.   Objective:   Vitals:   08/21/23 1613  BP: 125/69  Pulse: 93  Weight: 280 lb (127 kg)    Fetal Status: Fetal Heart Rate (bpm): 145   Movement: Present     General:  Alert, oriented and cooperative. Patient is in no acute distress.  Skin: Skin is warm and dry. No rash noted.   Cardiovascular: Normal heart rate noted  Respiratory: Normal respiratory effort, no problems with respiration noted  Abdomen: Soft, gravid, appropriate for gestational age.  Pain/Pressure: Absent     Pelvic: Cervical exam deferred        Extremities: Normal range of motion.  Edema: None  Mental Status: Normal mood and affect. Normal behavior. Normal judgment and thought content.   Assessment and Plan:  Pregnancy: [id number] at [redacted]w[redacted]d 1. Supervision of high risk pregnancy in third trimester (Primary) BP and FHR normal Doing well, feeling regular movement    2. [redacted] weeks gestation of pregnancy Anticipatory guidance regarding upcoming visits  3. Multigravida of advanced maternal age in third trimester BPP 8/8 EFW normal, afi normal  Serial growths antenatal testing  Fetal  UTD 10mm, Peds urology pp  Preterm labor symptoms and general obstetric precautions including but not limited to vaginal bleeding, contractions, leaking of fluid and fetal movement were reviewed in detail with the patient. Please refer to After Visit Summary for other counseling recommendations.   Return in about 2 weeks (around 09/04/2023) for OB VISIT (MD or APP).  Future Appointments  Date Time Provider Department Center  08/28/2023  1:00 PM [facility] NURSE [facility]  08/28/2023  1:15 PM [facility] NST [facility]  09/04/2023  1:00 PM [facility] NURSE [facility]  09/04/2023  1:15 PM [facility] NST [facility]  09/04/2023  2:15 PM Sue Lush, FNP [facility]  09/11/2023  1:00 PM [facility] NURSE [facility]  09/11/2023  1:15 PM [facility] NST [facility]  09/18/2023  1:15 PM [facility] NURSE [facility]  09/18/2023  1:30 PM [facility] US6 [facility]  09/25/2023  1:00 PM [facility] NURSE [facility]  09/25/2023  1:15 PM [facility] NST [facility]    Albertine Grates, FNP

## 2023-08-21 NOTE — Progress Notes (Signed)
 Patient information  Patient Name: Peggy Gaines  Patient MRN:   [id number]  Referring practice: MFM Referring Provider: [facility])  MFM CONSULT  Peggy Gaines is a 38 y.o. [phone] at [redacted]w[redacted]d here for ultrasound and consultation. Patient Active Problem List   Diagnosis Date Noted   Known fetal anomaly, antepartum (renal UTD) 08/21/2023   Obesity affecting pregnancy, antepartum 08/18/2023   Encounter for repeat ultrasound of fetal pyelectasis in singleton pregnancy, antepartum 08/18/2023   AMA (advanced maternal age) multigravida 35+ 08/18/2023   Pregnancy, supervision, high-risk 07/24/2023    Peggy Gaines has a pregnancy with the complications mentioned in the problem list. During today's visit we focused on the following concerns:   RE fetal urinary tract dilation: Right sided fetal urinary tract dilation was seen on today's ultrasound.  The right kidney measures 10.08 mm.  There is evidence of dilation of the renal pelvis, calyces and ureters on both kidneys.  There is still identifiable renal parenchyma but it is significantly compressed by the dilation of the renal pyramids. There is no evidence of a ureterocele or duplicated collecting system.  Amniotic fluid level is normal.  No other identifiable soft markers were seen.  These findings are most consistent with urinary tract dilation type A2-3.    I discussed these ultrasound findings with the patient.   Urinary tract dilation is identified up to 1 to 2% of pregnancies and reflects a spectrum of possible uropathologies.  I discussed the possible causes including transient/physiologic, ureteropelvic junction obstruction, vesicular ureteral reflux, ureterovesical junction obstruction and posterior urethral valves.  Much of the time the exact cause is not known prenatally due to lack of ability to perform the proper imaging.  Approximately 90% of cases of isolated pelviectasis will resolve, however,  nearly 90% will persist if the renal pelvis is greater than 10 mm. With severe renal dilation (>15 mm), in utero resolution is unlikely. Among fetuses with >7 mm renal dilation, approximately 40% will resolve or improve over the course of the pregnancy, 50% will remain unchanged, and 10% will worsen One third of these fetuses will require postnatal urologic surgery when there is persistent severe hydronephrosis (>71mm).  While outcomes are difficult to predict, the larger of the measurement of the renal pelvis the more likely it is to be caused by obstruction which has a greater risk of requiring surgery as well as other complications. Untreated vesicoureteral reflux can lead to recurrent renal infections, parenchymal scarring, and ultimate renal failure. I discussed the clinical implications largely depend on the etiology of the condition but can include urinary tract infections, urinary stone formation and renal dysfunction if the obstruction persists post delivery.  Consultation with a pediatric urologist is typically done prenatally for severe cases including renal pelvis greater than 10 mm, associated caliectasis, or other renal abnormalities.  All newborn's should have a consultation with pediatric urology.  We discussed the typical postdelivery course includes antibiotics, a urinary catheter and ultrasound of the kidneys along with a voiding cystourethrogram (VCUG).  Antenatal testing is reserved for low fluid.  Delivery at a tertiary care facility with pediatric urology availability is recommended in this case due to the severe obstruction.  There is a weak association with Down syndrome with a likelihood ratio of 1.5, however, if the cell free DNA screening is normal the risk is minimal for aneuploidy.  Amniocentesis can be considered if the patient desires but typically does not have diagnostic information if the screening is  negative.  Sonographic findings Single intrauterine pregnancy at 33w 4d  Fetal  cardiac activity:  Observed and appears normal. Presentation: Cephalic. The anatomic structures that were well seen appear normal except for right sided UTD of 10mm.  Fetal biometry shows the estimated fetal weight at the 40 percentile.  Amniotic fluid: Within normal limits.  MVP: 5.46 cm. Placenta: Anterior. Adnexa: No abnormality visualized.  There are limitations of prenatal ultrasound such as the inability to detect certain abnormalities due to poor visualization. Various factors such as fetal position, gestational age and maternal body habitus may increase the difficulty in visualizing the fetal anatomy.    Recommendations -EDD should be 10/05/2023 based on  U/S  (06/24/23). -Detailed ultrasound was done today  -Amniocentesis was offered but declined -Serial growth ultrasounds to continue to assess the fetal kidneys -Weekly antenatal testing due to elevated BMI -Referral to pediatric urologist for prenatal consultation was offered, the patient would like to wait until after delivery -After delivery antibiotics, catheterization and urologic imaging may be required. Typically imaging is delayed at least 48 hours after birth to allow for sufficient of the fetal urinary tract system except in cases of oligohydramnios, urethral obstruction and bilateral high-grade dilation. Ultimately these decision will be made by the pediatric providers.  -Delivery at a tertiary care center, this can occur at [facility]. She will be discussed at the [facility] meeting.  Review of Systems: A review of systems was performed and was negative except per HPI   Vitals and Physical Exam    08/07/2023    8:40 AM 08/07/2023    8:24 AM 07/24/2023    2:58 PM  Vitals with BMI  Weight  282 lbs 281 lbs  BMI   44  Systolic 122 148 161  Diastolic 78 87 83  Pulse 85 89 94  Sitting comfortably on the sonogram table Nonlabored breathing Normal rate and rhythm Abdomen is nontender  Past pregnancies OB History  Gravida Para Term  Preterm AB Living  4 2 1 1 1 2   SAB IAB Ectopic Multiple Live Births  1   0 2    # Outcome Date GA Lbr Len/2nd Weight Sex Type Anes PTL Lv  4 Current           3 Term 03/31/18 [redacted]w[redacted]d   M Vag-Spont EPI  LIV  2 Preterm 05/03/15 [redacted]w[redacted]d 13:25 / 00:15 4 lb 6.2 oz (1.99 kg) F Vag-Spont EPI  LIV  1 SAB 2014             I spent 45 minutes reviewing the patients chart, including labs and images as well as counseling the patient about her medical conditions. Greater than 50% of the time was spent in direct face-to-face patient counseling.  Braxton Feathers, DO Maternal fetal medicine, [place]   08/21/2023  4:09 PM

## 2023-08-21 NOTE — Patient Instructions (Signed)
 Deciding about Circumcision in Baby Boys  (The Basics)  What is circumcision?   Circumcision is a surgery that removes the skin that covers the tip of the penis, called the "foreskin" Circumcision is usually done when a boy is between 52 and 31 days old. In the [place], circumcision is common. In some other countries, fewer boys are circumcised. Circumcision is a common tradition in some religions.  Should I have my baby boy circumcised?   There is no easy answer. Circumcision has some benefits. But it also has risks. After talking with your doctor, you will have to decide for yourself what is right for your family.  What are the benefits of circumcision?   Circumcised boys seem to have slightly lower rates of: ?Urinary tract infections ?Swelling of the opening at the tip of the penis Circumcised men seem to have slightly lower rates of: ?Urinary tract infections ?Swelling of the opening at the tip of the penis ?Penis cancer ?HIV and other infections that you catch during sex ?Cervical cancer in the women they have sex with Even so, in the [place], the risks of these problems are small - even in boys and men who have not been circumcised. Plus, boys and men who are not circumcised can reduce these extra risks by: ?Cleaning their penis well ?Using condoms during sex  What are the risks of circumcision?  Risks include: ?Bleeding or infection from the surgery ?Damage to or amputation of the penis ?A chance that the doctor will cut off too much or not enough of the foreskin ?A chance that sex won't feel as good later in life Only about 1 out of every 200 circumcisions leads to problems. There is also a chance that your health insurance won't pay for circumcision.  How is circumcision done in baby boys?  First, the baby gets medicine for pain relief. This might be a cream on the skin or a shot into the base of the penis. Next, the doctor cleans the baby's penis well. Then  he or she uses special tools to cut off the foreskin. Finally, the doctor wraps a bandage (called gauze) around the baby's penis. If you have your baby circumcised, his doctor or nurse will give you instructions on how to care for him after the surgery. It is important that you follow those instructions carefully.

## 2023-08-28 ENCOUNTER — Ambulatory Visit: Admitting: *Deleted

## 2023-08-28 ENCOUNTER — Ambulatory Visit: Attending: Maternal & Fetal Medicine | Admitting: *Deleted

## 2023-08-28 VITALS — BP 129/63 | HR 71

## 2023-08-28 DIAGNOSIS — O0993 Supervision of high risk pregnancy, unspecified, third trimester: Secondary | ICD-10-CM

## 2023-08-28 DIAGNOSIS — O99213 Obesity complicating pregnancy, third trimester: Secondary | ICD-10-CM

## 2023-08-28 DIAGNOSIS — O09523 Supervision of elderly multigravida, third trimester: Secondary | ICD-10-CM | POA: Insufficient documentation

## 2023-08-28 DIAGNOSIS — Z3A34 34 weeks gestation of pregnancy: Secondary | ICD-10-CM | POA: Insufficient documentation

## 2023-08-28 DIAGNOSIS — E669 Obesity, unspecified: Secondary | ICD-10-CM | POA: Diagnosis not present

## 2023-08-28 NOTE — Procedures (Addendum)
 Roselani Grajeda September 19, 1985 [redacted]w[redacted]d  Fetus A Non-Stress Test Interpretation for 08/28/23  Indication:  Obesity  Fetal Heart Rate A Mode: External Baseline Rate (A): 140 bpm Variability: Moderate Accelerations: 15 x 15 Decelerations: None Multiple birth?: No  Uterine Activity Mode: Palpation, Toco Contraction Frequency (min): none Resting Tone Palpated: Relaxed  Interpretation (Fetal Testing) Nonstress Test Interpretation: Reactive Overall Impression: Reassuring for gestational age Comments: Dr. Judeth Cornfield reviewed tracing  MFM Note Modified BPP Patient is here for NST monitoring. NST is reactive. I performed a bedside ultrasound. Amniotic fluid is normal and good fetal heart activity is seen. AFI is 12 cm. Cephalic presentation. I reassured the patient of the findings. Reassuring Modified BPP is associated with a very low likelihood of stillbirth (0.8 per 1,000 births in one week).

## 2023-09-04 ENCOUNTER — Ambulatory Visit: Admitting: Family Medicine

## 2023-09-04 ENCOUNTER — Ambulatory Visit: Attending: Maternal & Fetal Medicine | Admitting: *Deleted

## 2023-09-04 ENCOUNTER — Ambulatory Visit (HOSPITAL_BASED_OUTPATIENT_CLINIC_OR_DEPARTMENT_OTHER): Admitting: *Deleted

## 2023-09-04 VITALS — BP 145/77 | HR 77

## 2023-09-04 VITALS — BP 117/75 | HR 86 | Wt 284.5 lb

## 2023-09-04 VITALS — BP 133/66

## 2023-09-04 DIAGNOSIS — O0993 Supervision of high risk pregnancy, unspecified, third trimester: Secondary | ICD-10-CM

## 2023-09-04 DIAGNOSIS — O99213 Obesity complicating pregnancy, third trimester: Secondary | ICD-10-CM | POA: Insufficient documentation

## 2023-09-04 DIAGNOSIS — Z3A35 35 weeks gestation of pregnancy: Secondary | ICD-10-CM

## 2023-09-04 DIAGNOSIS — O359XX0 Maternal care for (suspected) fetal abnormality and damage, unspecified, not applicable or unspecified: Secondary | ICD-10-CM | POA: Diagnosis not present

## 2023-09-04 DIAGNOSIS — O99210 Obesity complicating pregnancy, unspecified trimester: Secondary | ICD-10-CM

## 2023-09-04 DIAGNOSIS — R8271 Bacteriuria: Secondary | ICD-10-CM

## 2023-09-04 DIAGNOSIS — O99891 Other specified diseases and conditions complicating pregnancy: Secondary | ICD-10-CM | POA: Insufficient documentation

## 2023-09-04 DIAGNOSIS — O09523 Supervision of elderly multigravida, third trimester: Secondary | ICD-10-CM

## 2023-09-04 NOTE — Progress Notes (Signed)
   Subjective:  Peggy Gaines is a 38 y.o. [id number] at [redacted]w[redacted]d being seen today for ongoing prenatal care.  She is currently monitored for the following issues for this low-risk pregnancy and has Pregnancy, supervision, high-risk; Obesity affecting pregnancy, antepartum; Encounter for repeat ultrasound of fetal pyelectasis in singleton pregnancy, antepartum; AMA (advanced maternal age) multigravida 35+; Known fetal anomaly, antepartum (renal UTD); and Asymptomatic bacteriuria during pregnancy on their problem list.  Patient reports no complaints.  Contractions: Not present. Vag. Bleeding: None.  Movement: Present. Denies leaking of fluid.   The following portions of the patient's history were reviewed and updated as appropriate: allergies, current medications, past family history, past medical history, past social history, past surgical history and problem list. Problem list updated.  Objective:   Vitals:   09/04/23 1345  BP: 117/75  Pulse: 86  Weight: 284 lb 8 oz (129 kg)    Fetal Status:     Movement: Present     General:  Alert, oriented and cooperative. Patient is in no acute distress.  Skin: Skin is warm and dry. No rash noted.   Cardiovascular: Normal heart rate noted  Respiratory: Normal respiratory effort, no problems with respiration noted  Abdomen: Soft, gravid, appropriate for gestational age.       Pelvic: Vag. Bleeding: None     Cervical exam deferred        Extremities: Normal range of motion.     Mental Status: Normal mood and affect. Normal behavior. Normal judgment and thought content.   Urinalysis:      Assessment and Plan:  Pregnancy: [id number] at [redacted]w[redacted]d  1. Supervision of high risk pregnancy in third trimester (Primary) BP normal, NST with MFM prior to appointment reactive Still planning POPs for birth control after delivery Swabs next visit  2. Obesity affecting pregnancy, antepartum, unspecified obesity type F/w MFM, normal growth to date Discuss delivery  timing at next visit  3. Known fetal anomaly, antepartum, single or unspecified fetus F/w MFM, will needs Peds Urology referral PP  4. Multigravida of advanced maternal age in third trimester F/w MFM  5. Asymptomatic bacteriuria during pregnancy Reports she took treatment TOC today  Preterm labor symptoms and general obstetric precautions including but not limited to vaginal bleeding, contractions, leaking of fluid and fetal movement were reviewed in detail with the patient. Please refer to After Visit Summary for other counseling recommendations.  Return in 1 week (on 09/11/2023) for [facility], ob visit.   Venora Maples, MD

## 2023-09-04 NOTE — Patient Instructions (Signed)

## 2023-09-04 NOTE — Procedures (Signed)
 Peggy Gaines March 10, 1986 [redacted]w[redacted]d  Fetus A Non-Stress Test Interpretation for 09/04/23  NST only  Indication:  Obesity  Fetal Heart Rate A Mode: External Baseline Rate (A): 140 bpm Variability: Moderate Accelerations: 15 x 15 Decelerations: None Multiple birth?: No  Uterine Activity Mode: Palpation, Toco Contraction Frequency (min): none Resting Tone Palpated: Relaxed  Interpretation (Fetal Testing) Nonstress Test Interpretation: Reactive Overall Impression: Reassuring for gestational age Comments: Dr. Parke Poisson reviewed tracing

## 2023-09-07 LAB — CULTURE, OB URINE

## 2023-09-11 ENCOUNTER — Ambulatory Visit: Attending: Maternal & Fetal Medicine | Admitting: *Deleted

## 2023-09-11 ENCOUNTER — Ambulatory Visit (HOSPITAL_BASED_OUTPATIENT_CLINIC_OR_DEPARTMENT_OTHER): Admitting: *Deleted

## 2023-09-11 ENCOUNTER — Other Ambulatory Visit (HOSPITAL_COMMUNITY)
Admission: RE | Admit: 2023-09-11 | Discharge: 2023-09-11 | Disposition: A | Discharge status: Home / Self Care | Source: Ambulatory Visit | Attending: Advanced Practice Midwife | Admitting: Advanced Practice Midwife

## 2023-09-11 ENCOUNTER — Inpatient Hospital Stay (HOSPITAL_COMMUNITY)
Admission: AD | Admit: 2023-09-11 | Discharge: 2023-09-11 | Disposition: A | Discharge status: Home / Self Care | Attending: Obstetrics and Gynecology | Admitting: Obstetrics and Gynecology

## 2023-09-11 ENCOUNTER — Other Ambulatory Visit: Payer: Self-pay

## 2023-09-11 ENCOUNTER — Ambulatory Visit: Admitting: Advanced Practice Midwife

## 2023-09-11 ENCOUNTER — Inpatient Hospital Stay (HOSPITAL_BASED_OUTPATIENT_CLINIC_OR_DEPARTMENT_OTHER)

## 2023-09-11 ENCOUNTER — Encounter (HOSPITAL_COMMUNITY): Payer: Self-pay | Admitting: Obstetrics and Gynecology

## 2023-09-11 VITALS — BP 136/77 | HR 78

## 2023-09-11 VITALS — BP 139/88 | HR 80 | Wt 284.5 lb

## 2023-09-11 DIAGNOSIS — O99210 Obesity complicating pregnancy, unspecified trimester: Secondary | ICD-10-CM

## 2023-09-11 DIAGNOSIS — O99213 Obesity complicating pregnancy, third trimester: Secondary | ICD-10-CM | POA: Insufficient documentation

## 2023-09-11 DIAGNOSIS — X58XXXA Exposure to other specified factors, initial encounter: Secondary | ICD-10-CM | POA: Insufficient documentation

## 2023-09-11 DIAGNOSIS — O99893 Other specified diseases and conditions complicating puerperium: Secondary | ICD-10-CM | POA: Diagnosis not present

## 2023-09-11 DIAGNOSIS — R600 Localized edema: Secondary | ICD-10-CM

## 2023-09-11 DIAGNOSIS — M7989 Other specified soft tissue disorders: Secondary | ICD-10-CM | POA: Insufficient documentation

## 2023-09-11 DIAGNOSIS — E669 Obesity, unspecified: Secondary | ICD-10-CM

## 2023-09-11 DIAGNOSIS — Z3A36 36 weeks gestation of pregnancy: Secondary | ICD-10-CM

## 2023-09-11 DIAGNOSIS — O0993 Supervision of high risk pregnancy, unspecified, third trimester: Secondary | ICD-10-CM | POA: Diagnosis present

## 2023-09-11 DIAGNOSIS — R609 Edema, unspecified: Secondary | ICD-10-CM | POA: Diagnosis present

## 2023-09-11 DIAGNOSIS — O1203 Gestational edema, third trimester: Secondary | ICD-10-CM | POA: Diagnosis not present

## 2023-09-11 DIAGNOSIS — S8012XA Contusion of left lower leg, initial encounter: Secondary | ICD-10-CM | POA: Diagnosis not present

## 2023-09-11 DIAGNOSIS — T148XXA Other injury of unspecified body region, initial encounter: Secondary | ICD-10-CM

## 2023-09-11 NOTE — MAU Provider Note (Signed)
 History     CSN: [id number]  Arrival date and time: 09/11/23 1704   Event Date/Time   First Provider Initiated Contact with Patient 09/11/23 1811      Chief Complaint  Patient presents with   DVT Rule Out   Peggy Gaines , a  38 y.o. [id number] at [redacted]w[redacted]d presents to MAU without complaints. 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 She noted that 2 weeks ago she began to have broken blood vessels around the ankle with swelling. She states that both sides "tend to get about the same size after work" but the left takes longer to go down and sometimes is "puffier." She denies problems with gait or ambulation. She also denies problems with pain, redness or tenderness. Reports in the office today her left calf was 3cm larger than the right. She has no OB complaints and endorses positive fetal movement.   Per Report of referring CNM "swelling/pain in left leg and calf measurement 3 cm larger than right leg and pt with ruptured capillaries/varicose veins in same leg."         OB History     Gravida  4   Para  2   Term  1   Preterm  1   AB  1   Living  2      SAB  1   IAB      Ectopic      Multiple  0   Live Births  2           Past Medical History:  Diagnosis Date   BMI 35.0-35.9,adult    BMI 38.0-38.9,adult 04/30/2015   Breastfeeding (infant) 05/05/2015   Migraine without aura    Obesity    Obesity affecting pregnancy in third trimester, antepartum 04/30/2015   Postpartum care following vaginal delivery 04/30/2015    Past Surgical History:  Procedure Laterality Date   TONSILLECTOMY      Family History  Problem Relation Age of Onset   Hypertension Mother     Social History   Tobacco Use   Smoking status: Never   Smokeless tobacco: Never  Substance Use Topics   Alcohol use: No   Drug use: No     Allergies: No Known Allergies  Medications Prior to Admission  Medication Sig Dispense Refill Last Dose/Taking   Prenatal Vit-Fe Fumarate-FA (PREPLUS) 27-1 MG TABS Take 1 tablet by mouth daily. 90 tablet 3 09/11/2023 Morning    Review of Systems  Constitutional:  Negative for chills, fatigue and fever.  Eyes:  Negative for pain and visual disturbance.  Respiratory:  Negative for apnea, shortness of breath and wheezing.   Cardiovascular:  Positive for leg swelling. Negative for chest pain and palpitations.  Gastrointestinal:  Negative for abdominal pain, constipation, diarrhea, nausea and vomiting.  Genitourinary:  Negative for difficulty urinating, dysuria, pelvic pain, vaginal bleeding, vaginal discharge and vaginal pain.  Musculoskeletal:  Negative for back pain and gait problem.  Skin:  Negative for color change, rash and wound.  Neurological:  Negative for seizures, weakness and headaches.  Psychiatric/Behavioral:  Negative for suicidal ideas.    Physical Exam   Blood pressure 122/62, pulse 77, temperature 98.4 F (36.9 C), temperature source Oral, resp. rate 15, last menstrual period 03/07/2023, SpO2 98%, unknown if currently breastfeeding.  Physical Exam Vitals and nursing note reviewed.  Constitutional:  General: She is not in acute distress.    Appearance: Normal appearance.  HENT:     Head: Normocephalic.  Cardiovascular:     Pulses:          Dorsalis pedis pulses are 1+ on the right side and 1+ on the left side.  Pulmonary:     Effort: Pulmonary effort is normal.  Musculoskeletal:     Cervical back: Normal range of motion.     Left foot: Normal range of motion. No deformity.  Feet:     Right foot:     Protective Sensation: 3 sites tested.  3 sites sensed.     Skin integrity: No warmth.     Left foot:     Protective Sensation: 3 sites tested.  3 sites sensed.     Comments: Multiple broken blood vessels and varicose veins around the ankle and up into the  calf. Non tender on palpation.  Skin:    General: Skin is warm and dry.  Neurological:     Mental Status: She is alert and oriented to person, place, and time.  Psychiatric:        Mood and Affect: Mood normal.        FHT: 145bpm with moderate variability 15x15 accels no decels  Toco: Quiet    MAU Course  Procedures Orders Placed This Encounter  Procedures   VAS [place] LOWER EXTREMITY VENOUS (DVT)   VAS [place] LOWER EXTREMITY VENOUS (DVT) Result Date: 09/11/2023  Lower Venous DVT Study Patient Name:  Peggy Gaines  Date of Exam:   09/11/2023 Medical Rec #: [id number]          Accession #:    [id number] Date of Birth: 06-18-1985           Patient Gender: F Patient Age:   7 years Exam Location:  [facility] Procedure:      VAS [place] LOWER EXTREMITY VENOUS (DVT) Referring Phys: Warrick Parisian Aris Moman --------------------------------------------------------------------------------  Indications: Swelling, and Edema.  Risk Factors: Obesity and past pregnancy. Comparison Study: None. Performing Technologist: Shona Simpson  Examination Guidelines: A complete evaluation includes B-mode imaging, spectral Doppler, color Doppler, and power Doppler as needed of all accessible portions of each vessel. Bilateral testing is considered an integral part of a complete examination. Limited examinations for reoccurring indications may be performed as noted. The reflux portion of the exam is performed with the patient in reverse Trendelenburg.  +-----+---------------+---------+-----------+----------+--------------+ RIGHTCompressibilityPhasicitySpontaneityPropertiesThrombus Aging +-----+---------------+---------+-----------+----------+--------------+ CFV  Full           Yes      Yes                                 +-----+---------------+---------+-----------+----------+--------------+   +---------+---------------+---------+-----------+----------+--------------+ LEFT      CompressibilityPhasicitySpontaneityPropertiesThrombus Aging +---------+---------------+---------+-----------+----------+--------------+ CFV      Full           Yes      Yes                                 +---------+---------------+---------+-----------+----------+--------------+ SFJ      Full                                                        +---------+---------------+---------+-----------+----------+--------------+  FV Prox  Full                                                        +---------+---------------+---------+-----------+----------+--------------+ FV Mid   Full                                                        +---------+---------------+---------+-----------+----------+--------------+ FV DistalFull                                                        +---------+---------------+---------+-----------+----------+--------------+ PFV      Full                                                        +---------+---------------+---------+-----------+----------+--------------+ POP      Full           Yes      Yes                                 +---------+---------------+---------+-----------+----------+--------------+ PTV      Full                    Yes                                 +---------+---------------+---------+-----------+----------+--------------+ PERO     Full                    Yes                                 +---------+---------------+---------+-----------+----------+--------------+     Summary: RIGHT: - No evidence of common femoral vein obstruction.   LEFT: - There is no evidence of deep vein thrombosis in the lower extremity.  - No cystic structure found in the popliteal fossa.  *See table(s) above for measurements and observations.    Preliminary     MDM - FHT appropriate for gestational age  - No signs of DVT with normal dopplers.  - plan for discharge.   Assessment and Plan   1. Swelling of lower  extremity during pregnancy in third trimester   2. [redacted] weeks gestation of pregnancy   3. Bruising    - Reviewed the bruising is likely caused by numerous broken blood vessels and varicosities.  - Recommended compression stockings especially while at work and on feet.  - Reviewed worsening signs and return precautions - FHT appropriate for gestational age at time of discharge.  - Patient discharge in stable condition and may return to MAU as needed.   Claudette Head, MSN CNM  09/11/2023, 6:11 PM

## 2023-09-11 NOTE — Progress Notes (Signed)
 Lower extremity venous duplex completed. Please see CV Procedures for preliminary results.  Shona Simpson, RVT 09/11/23 6:16 PM

## 2023-09-11 NOTE — Progress Notes (Signed)
 PRENATAL VISIT NOTE  Subjective:  Peggy Gaines is a 38 y.o. [id number] at [redacted]w[redacted]d being seen today for ongoing prenatal care.  She is currently monitored for the following issues for this high-risk pregnancy and has Pregnancy, supervision, high-risk; Obesity affecting pregnancy, antepartum; Encounter for repeat ultrasound of fetal pyelectasis in singleton pregnancy, antepartum; AMA (advanced maternal age) multigravida 35+; Known fetal anomaly, antepartum (renal UTD); and Asymptomatic bacteriuria during pregnancy on their problem list.  Patient reports left lower leg pain. Vag. Bleeding: None.  Movement: Present. Denies leaking of fluid.   The following portions of the patient's history were reviewed and updated as appropriate: allergies, current medications, past family history, past medical history, past social history, past surgical history and problem list.   Objective:   Vitals:   09/11/23 1453  BP: 139/88  Pulse: 80  Weight: 284 lb 8 oz (129 kg)    Fetal Status: Fetal Heart Rate (bpm): 146 Fundal Height: 38 cm Movement: Present     General:  Alert, oriented and cooperative. Patient is in no acute distress.  Skin: Skin is warm and dry. No rash noted.   Cardiovascular: Normal heart rate noted  Respiratory: Normal respiratory effort, no problems with respiration noted  Abdomen: Soft, gravid, appropriate for gestational age.  Pain/Pressure: Absent     Pelvic: Cervical exam deferred        Extremities: Normal range of motion.  Edema: Trace  Mental Status: Normal mood and affect. Normal behavior. Normal judgment and thought content.   Assessment and Plan:  Pregnancy: [id number] at [redacted]w[redacted]d 1. Supervision of high risk pregnancy in third trimester (Primary) - GC/Chlamydia probe amp ([place])not at [facility] - Culture, beta strep (group b only)  2. [redacted] weeks gestation of pregnancy   3. Edema of left lower leg -Right calf measured 45 cm circumference left leg measured 48 cm  circumference -Broken capillaries noted  -left ankle swollen  -sent to MAU for DVT work up   Term labor symptoms and general obstetric precautions including but not limited to vaginal bleeding, contractions, leaking of fluid and fetal movement were reviewed in detail with the patient. Please refer to After Visit Summary for other counseling recommendations.   Return in about 1 week (around 09/18/2023) for HROB.  Future Appointments  Date Time Provider Department Center  09/11/2023  6:00 PM [facility] VASC US 5 [facility]  09/18/2023  1:00 PM [facility] PROVIDER 1 [facility]  09/18/2023  1:30 PM [facility] US6 [facility]  09/18/2023  2:35 PM Federico Flake, MD [facility]  09/25/2023  1:00 PM [facility] NURSE [facility]  09/25/2023  1:15 PM [facility] NST [facility]  09/25/2023  2:15 PM [name], [name] Floor, CNM [facility]    [name], RN Student-NP  CNM attestation:  I have seen and examined this patient; I agree with above documentation in the NP student's note.   Peggy Gaines is a 38 y.o. [id number] in the [facility] office for routine prenatal visit for high risk pregnancy. See problem list below. +FM, denies LOF, VB, contractions, vaginal discharge.  Patient Active Problem List   Diagnosis Date Noted   Asymptomatic bacteriuria during pregnancy 09/04/2023   Known fetal anomaly, antepartum (renal UTD) 08/21/2023   Obesity affecting pregnancy, antepartum 08/18/2023   Encounter for repeat ultrasound of fetal pyelectasis in singleton pregnancy, antepartum 08/18/2023   AMA (advanced maternal age) multigravida 35+ 08/18/2023   Pregnancy, supervision, high-risk 07/24/2023     ROS, labs, PMH reviewed  PE: BP  139/88   Pulse 80   Wt 284 lb 8 oz (129 kg)   LMP 03/07/2023 Comment: pt unsure, states she has been stressed but think her LMP was in early October  BMI 44.56 kg/m  Gen: calm comfortable, well appearing Resp: normal effort, no distress Abd: gravid appropriate for  gestational age  Fundal height:38 FHT by doppler: 146  Plan: - fetal kick counts reinforced, term labor precautions -    1. Supervision of high risk pregnancy in third trimester (Primary)  - GC/Chlamydia probe amp ([place])not at [facility] - Culture, beta strep (group b only)  2. [redacted] weeks gestation of pregnancy   3. Edema of left lower leg --Pain is generalized and neg Homans sign but significant difference in calf measurement with left leg 3 cm larger than right.  Pt to MAU for DVT eval.     [name], CNM 5:52 PM

## 2023-09-11 NOTE — Procedures (Signed)
 Peggy Gaines [birth year] [redacted]w[redacted]d  Fetus A Non-Stress Test Interpretation for 09/11/23  NST only  Indication:  obesity  Fetal Heart Rate A Mode: External Baseline Rate (A): 135 bpm Variability: Moderate Accelerations: 15 x 15 Decelerations: None Multiple birth?: No  Uterine Activity Mode: Palpation, Toco Contraction Frequency (min): none Resting Tone Palpated: Relaxed  Interpretation (Fetal Testing) Nonstress Test Interpretation: Reactive Overall Impression: Reassuring for gestational age Comments: Dr. Judeth Cornfield reviewed tracing

## 2023-09-11 NOTE — MAU Note (Addendum)
...  Peggy Gaines is a 38 y.o. at [redacted]w[redacted]d here in MAU reporting: Sent over from the office for further evaluation of left calf swelling. She reports her left calf measured 3 cm to touch. Denies pain in her legs or pain with palpation. The patient reports she feels as if her left ankle is what is swollen. Denies other complaints. Had an NST in office. Denies VB or LOF. +FM.  Onset of complaint: Two weeks Pain score: Denies pain.  Vitals:   09/11/23 1714  BP: 138/76  Pulse: 77  Temp: 98.4 F (36.9 C)  SpO2: 99%     FHT: 151 initial external Lab orders placed from triage: none

## 2023-09-12 LAB — GC/CHLAMYDIA PROBE AMP (~~LOC~~) NOT AT ARMC
Chlamydia: NEGATIVE
Comment: NEGATIVE
Comment: NORMAL
Neisseria Gonorrhea: NEGATIVE

## 2023-09-14 LAB — CULTURE, BETA STREP (GROUP B ONLY): Strep Gp B Culture: NEGATIVE

## 2023-09-18 ENCOUNTER — Ambulatory Visit: Attending: Maternal & Fetal Medicine

## 2023-09-18 ENCOUNTER — Ambulatory Visit

## 2023-09-18 ENCOUNTER — Encounter: Payer: Self-pay | Admitting: Family Medicine

## 2023-09-18 ENCOUNTER — Other Ambulatory Visit: Payer: Self-pay

## 2023-09-18 ENCOUNTER — Ambulatory Visit: Admitting: Obstetrics and Gynecology

## 2023-09-18 ENCOUNTER — Ambulatory Visit: Admitting: Family Medicine

## 2023-09-18 VITALS — BP 138/66 | HR 78

## 2023-09-18 VITALS — BP 128/85 | HR 87 | Wt 282.0 lb

## 2023-09-18 DIAGNOSIS — O359XX0 Maternal care for (suspected) fetal abnormality and damage, unspecified, not applicable or unspecified: Secondary | ICD-10-CM

## 2023-09-18 DIAGNOSIS — E669 Obesity, unspecified: Secondary | ICD-10-CM

## 2023-09-18 DIAGNOSIS — O99210 Obesity complicating pregnancy, unspecified trimester: Secondary | ICD-10-CM

## 2023-09-18 DIAGNOSIS — Z3A37 37 weeks gestation of pregnancy: Secondary | ICD-10-CM | POA: Insufficient documentation

## 2023-09-18 DIAGNOSIS — E6689 Other obesity not elsewhere classified: Secondary | ICD-10-CM

## 2023-09-18 DIAGNOSIS — O35EXX0 Maternal care for other (suspected) fetal abnormality and damage, fetal genitourinary anomalies, not applicable or unspecified: Secondary | ICD-10-CM | POA: Diagnosis present

## 2023-09-18 DIAGNOSIS — O99213 Obesity complicating pregnancy, third trimester: Secondary | ICD-10-CM

## 2023-09-18 DIAGNOSIS — O0993 Supervision of high risk pregnancy, unspecified, third trimester: Secondary | ICD-10-CM

## 2023-09-18 NOTE — Progress Notes (Signed)
  Maternal-Fetal Medicine Consultation G4 [phone] at 37w 4d gestation.  Patient returned for fetal growth assessment.  Right urinary tract dilation was seen on previous ultrasound.  Ultrasound Fetal growth is appropriate for gestational age.  Amniotic fluid is normal good fetal activity seen.  Cephalic presentation.  Antenatal testing is reassuring.  BPP 8/8. Right urinary tract dilation is seen again.  Pelvic calyceal dilation with right ureteric dilation is seen.  Renal pelvis measures 16 mm.  No evidence of cortical hyperechogenicity. Antenatal testing is reassuring.  BPP 8/8.  I explained the finding of right UTD on the possibility of obstructive uropathy (at pelviureteric junction or at vesicoureteric junction).  Only postnatal evaluation will determine the extent of obstruction.  Grade 3 obesity is associated with a small increase in the risk of stillbirth (2.5-2 3-fold) but the absolute risk is very small.  Recommendations - BPP next week. - Consider delivery at 39 to [redacted] weeks gestation. -Postnatal renal ultrasound of the newborn.   Consultation including face-to-face (more than 50%) counseling 20 minutes.

## 2023-09-18 NOTE — Progress Notes (Signed)
   PRENATAL VISIT NOTE  Subjective:  Peggy Gaines is a 38 y.o. [id number] at [redacted]w[redacted]d being seen today for ongoing prenatal care.  She is currently monitored for the following issues for this high-risk pregnancy and has Pregnancy, supervision, high-risk; Obesity affecting pregnancy, antepartum; Encounter for repeat ultrasound of fetal pyelectasis in singleton pregnancy, antepartum; AMA (advanced maternal age) multigravida 35+; Known fetal anomaly, antepartum (renal UTD); and Asymptomatic bacteriuria during pregnancy on their problem list.  Patient reports no complaints.  Contractions: Not present. Vag. Bleeding: None.  Movement: Present. Denies leaking of fluid.   The following portions of the patient's history were reviewed and updated as appropriate: allergies, current medications, past family history, past medical history, past social history, past surgical history and problem list.   Objective:   Vitals:   09/18/23 1452  BP: 128/85  Pulse: 87  Weight: 282 lb (127.9 kg)    Fetal Status: Fetal Heart Rate (bpm): 143   Movement: Present     General:  Alert, oriented and cooperative. Patient is in no acute distress.  Skin: Skin is warm and dry. No rash noted.   Cardiovascular: Normal heart rate noted  Respiratory: Normal respiratory effort, no problems with respiration noted  Abdomen: Soft, gravid, appropriate for gestational age.  Pain/Pressure: Absent     Pelvic: Cervical exam deferred        Extremities: Normal range of motion.  Edema: None  Mental Status: Normal mood and affect. Normal behavior. Normal judgment and thought content.   Assessment and Plan:  Pregnancy: [id number] at [redacted]w[redacted]d 1. Supervision of high risk pregnancy in third trimester Up to date No concerns FH appropriate  Feeling movement  2. Obesity affecting pregnancy, antepartum, unspecified obesity type TWG=22 lb (9.979 kg)  MFM recommended IOl at 39-40 wk based on BMI Dicussed with patient today. She will think  about this.   3. Known fetal anomaly, antepartum, single or unspecified fetus Fetal pyelectasis  4. [redacted] weeks gestation of pregnancy (Primary)   Term labor symptoms and general obstetric precautions including but not limited to vaginal bleeding, contractions, leaking of fluid and fetal movement were reviewed in detail with the patient. Please refer to After Visit Summary for other counseling recommendations.   Return in about 1 week (around 09/25/2023) for Routine prenatal care.  Future Appointments  Date Time Provider Department Center  10/05/2023  6:45 AM MC-LD SCHED ROOM [facility]IN[facility] None  11/16/2023 10:35 AM Felipe Horton, [place] , CNM [facility]    Abner Ables, MD

## 2023-09-21 ENCOUNTER — Encounter (HOSPITAL_COMMUNITY): Payer: Self-pay

## 2023-09-21 ENCOUNTER — Telehealth (HOSPITAL_COMMUNITY): Payer: Self-pay | Admitting: *Deleted

## 2023-09-21 NOTE — Telephone Encounter (Signed)
 Preadmission screen

## 2023-09-25 ENCOUNTER — Ambulatory Visit: Admitting: Obstetrics and Gynecology

## 2023-09-25 ENCOUNTER — Ambulatory Visit: Payer: Self-pay | Admitting: Advanced Practice Midwife

## 2023-09-25 ENCOUNTER — Other Ambulatory Visit: Payer: Self-pay

## 2023-09-25 ENCOUNTER — Ambulatory Visit

## 2023-09-25 ENCOUNTER — Ambulatory Visit: Attending: Maternal & Fetal Medicine | Admitting: *Deleted

## 2023-09-25 VITALS — BP 117/72 | HR 80 | Wt 285.5 lb

## 2023-09-25 VITALS — BP 132/71 | HR 67

## 2023-09-25 DIAGNOSIS — O35EXX0 Maternal care for other (suspected) fetal abnormality and damage, fetal genitourinary anomalies, not applicable or unspecified: Secondary | ICD-10-CM

## 2023-09-25 DIAGNOSIS — O0993 Supervision of high risk pregnancy, unspecified, third trimester: Secondary | ICD-10-CM

## 2023-09-25 DIAGNOSIS — O99210 Obesity complicating pregnancy, unspecified trimester: Secondary | ICD-10-CM

## 2023-09-25 DIAGNOSIS — O99213 Obesity complicating pregnancy, third trimester: Secondary | ICD-10-CM

## 2023-09-25 DIAGNOSIS — Z3A38 38 weeks gestation of pregnancy: Secondary | ICD-10-CM

## 2023-09-25 NOTE — Progress Notes (Signed)
   PRENATAL VISIT NOTE  Subjective:  Peggy Gaines is a 38 y.o. [id number] at [redacted]w[redacted]d being seen today for ongoing prenatal care.  She is currently monitored for the following issues for this high-risk pregnancy and has Pregnancy, supervision, high-risk; Obesity affecting pregnancy, antepartum; Encounter for repeat ultrasound of fetal pyelectasis in singleton pregnancy, antepartum; AMA (advanced maternal age) multigravida 35+; Known fetal anomaly, antepartum (renal UTD); and Asymptomatic bacteriuria during pregnancy on their problem list.  Patient reports no complaints.  Contractions: Not present.  .  Movement: Present. Denies leaking of fluid.   The following portions of the patient's history were reviewed and updated as appropriate: allergies, current medications, past family history, past medical history, past social history, past surgical history and problem list.   Objective:   Vitals:   09/25/23 1405  BP: 117/72  Pulse: 80  Weight: 285 lb 8 oz (129.5 kg)    Fetal Status: Fetal Heart Rate (bpm): 143 Fundal Height: 38 cm Movement: Present     General:  Alert, oriented and cooperative. Patient is in no acute distress.  Skin: Skin is warm and dry. No rash noted.   Cardiovascular: Normal heart rate noted  Respiratory: Normal respiratory effort, no problems with respiration noted  Abdomen: Soft, gravid, appropriate for gestational age.  Pain/Pressure: Absent     Pelvic: Cervical exam deferred        Extremities: Normal range of motion.  Edema: Trace  Mental Status: Normal mood and affect. Normal behavior. Normal judgment and thought content.   Assessment and Plan:  Pregnancy: [id number] at [redacted]w[redacted]d 1. Supervision of high risk pregnancy in third trimester (Primary) --Anticipatory guidance about next visits/weeks of pregnancy given.  --IOL scheduled between 39-40 weeks  2. Obesity affecting pregnancy, antepartum, unspecified obesity type   3. Pyelectasis of fetus on prenatal  ultrasound --Peds urology to f/u after delivey  4. [redacted] weeks gestation of pregnancy   Term labor symptoms and general obstetric precautions including but not limited to vaginal bleeding, contractions, leaking of fluid and fetal movement were reviewed in detail with the patient. Please refer to After Visit Summary for other counseling recommendations.   Return in about 1 week (around 10/02/2023).  Future Appointments  Date Time Provider Department Center  10/02/2023  1:15 PM Davis, Devon E, PA-C [facility]  10/05/2023  6:45 AM [facility]-LD SCHED ROOM [facility]IN[facility] None  11/16/2023 10:35 AM Felipe Horton, [place] , CNM [facility]    Arlester Bence, CNM

## 2023-09-25 NOTE — Procedures (Signed)
 Peggy Gaines [birth year] [redacted]w[redacted]d  Fetus A Non-Stress Test Interpretation for 09/25/23  NST only  Indication:  Obesity  Fetal Heart Rate A Mode: External Baseline Rate (A): 140 bpm Variability: Moderate Accelerations: 15 x 15 Decelerations: None Multiple birth?: No  Uterine Activity Mode: Palpation, Toco Contraction Frequency (min): none Resting Tone Palpated: Relaxed  Interpretation (Fetal Testing) Nonstress Test Interpretation: Reactive Overall Impression: Reassuring for gestational age Comments: Dr. Arnie Bibber reviewed tracing

## 2023-09-26 ENCOUNTER — Telehealth (HOSPITAL_COMMUNITY): Payer: Self-pay | Admitting: *Deleted

## 2023-09-26 NOTE — Telephone Encounter (Signed)
 Preadmission screen

## 2023-09-27 ENCOUNTER — Telehealth (HOSPITAL_COMMUNITY): Payer: Self-pay | Admitting: *Deleted

## 2023-09-27 ENCOUNTER — Encounter (HOSPITAL_COMMUNITY): Payer: Self-pay | Admitting: *Deleted

## 2023-09-27 NOTE — Telephone Encounter (Signed)
 Preadmission screen

## 2023-10-01 NOTE — Progress Notes (Unsigned)
   PRENATAL VISIT NOTE  Subjective:  Peggy Gaines is a 38 y.o. [id number] at [redacted]w[redacted]d being seen today for ongoing prenatal care.  She is currently monitored for the following issues for this {Blank single:19197::"high-risk","low-risk"} pregnancy and has Pregnancy, supervision, high-risk; Obesity affecting pregnancy, antepartum; Encounter for repeat ultrasound of fetal pyelectasis in singleton pregnancy, antepartum; AMA (advanced maternal age) multigravida 35+; Known fetal anomaly, antepartum (renal UTD); and Asymptomatic bacteriuria during pregnancy on their problem list.  Patient reports {sx:14538}.   .  .   . Denies leaking of fluid.   The following portions of the patient's history were reviewed and updated as appropriate: allergies, current medications, past family history, past medical history, past social history, past surgical history and problem list.   Objective:  There were no vitals filed for this visit.  Fetal Status:           General:  Alert, oriented and cooperative. Patient is in no acute distress.  Skin: Skin is warm and dry. No rash noted.   Cardiovascular: Normal heart rate noted  Respiratory: Normal respiratory effort, no problems with respiration noted  Abdomen: Soft, gravid, appropriate for gestational age.        Pelvic: {Blank single:19197::"Cervical exam performed in the presence of a chaperone","Cervical exam deferred"}        Extremities: Normal range of motion.     Mental Status: Normal mood and affect. Normal behavior. Normal judgment and thought content.   Assessment and Plan:  Pregnancy: [id number] at [redacted]w[redacted]d  1. Supervision of high risk pregnancy in third trimester (Primary) Patient is doing well, feeling regular fetal movement BP, FHR, FH appropriate  2. [redacted] weeks gestation of pregnancy Anticipatory guidance about next visit/weeks of pregnancy given  3. BMI 40.0-44.9, adult (HCC) 09/18/2023 growth scan and antenatal testing WNL, with recommendation for  delivery at 40 weeks  IOL scheduled 10/05/2023   4. Asymptomatic bacteriuria during pregnancy Urine TOC today***  Term labor symptoms and general obstetric precautions including but not limited to vaginal bleeding, contractions, leaking of fluid and fetal movement were reviewed in detail with the patient.  Please refer to After Visit Summary for other counseling recommendations.   No follow-ups on file.  Future Appointments  Date Time Provider Department Center  10/02/2023  1:15 PM Azelea Seguin E, PA-C [facility]  10/05/2023  6:45 AM [facility] SCHED ROOM [facility] None  11/16/2023 10:35 AM Felipe Horton, [place] , CNM [facility]    Luevenia Saha, PA-C

## 2023-10-02 ENCOUNTER — Ambulatory Visit: Admitting: Physician Assistant

## 2023-10-02 ENCOUNTER — Other Ambulatory Visit: Payer: Self-pay

## 2023-10-02 VITALS — BP 132/82 | HR 73 | Wt 289.8 lb

## 2023-10-02 DIAGNOSIS — Z6841 Body Mass Index (BMI) 40.0 and over, adult: Secondary | ICD-10-CM | POA: Diagnosis not present

## 2023-10-02 DIAGNOSIS — Z3A39 39 weeks gestation of pregnancy: Secondary | ICD-10-CM

## 2023-10-02 DIAGNOSIS — R8271 Bacteriuria: Secondary | ICD-10-CM

## 2023-10-02 DIAGNOSIS — O99891 Other specified diseases and conditions complicating pregnancy: Secondary | ICD-10-CM

## 2023-10-02 DIAGNOSIS — O0993 Supervision of high risk pregnancy, unspecified, third trimester: Secondary | ICD-10-CM

## 2023-10-03 ENCOUNTER — Encounter: Payer: Self-pay | Admitting: Family Medicine

## 2023-10-05 ENCOUNTER — Inpatient Hospital Stay (HOSPITAL_COMMUNITY): Admitting: Anesthesiology

## 2023-10-05 ENCOUNTER — Inpatient Hospital Stay (HOSPITAL_COMMUNITY)

## 2023-10-05 ENCOUNTER — Inpatient Hospital Stay (HOSPITAL_COMMUNITY)
Admission: RE | Admit: 2023-10-05 | Discharge: 2023-10-07 | DRG: 807 | Disposition: A | Discharge status: Home / Self Care | Attending: Obstetrics & Gynecology | Admitting: Obstetrics & Gynecology

## 2023-10-05 ENCOUNTER — Encounter (HOSPITAL_COMMUNITY): Payer: Self-pay | Admitting: Family Medicine

## 2023-10-05 ENCOUNTER — Other Ambulatory Visit: Payer: Self-pay

## 2023-10-05 DIAGNOSIS — Z30430 Encounter for insertion of intrauterine contraceptive device: Secondary | ICD-10-CM | POA: Diagnosis not present

## 2023-10-05 DIAGNOSIS — O99214 Obesity complicating childbirth: Secondary | ICD-10-CM | POA: Diagnosis present

## 2023-10-05 DIAGNOSIS — O99213 Obesity complicating pregnancy, third trimester: Principal | ICD-10-CM | POA: Diagnosis present

## 2023-10-05 DIAGNOSIS — E66813 Obesity, class 3: Secondary | ICD-10-CM | POA: Diagnosis present

## 2023-10-05 DIAGNOSIS — Z3A40 40 weeks gestation of pregnancy: Secondary | ICD-10-CM | POA: Diagnosis not present

## 2023-10-05 DIAGNOSIS — O09523 Supervision of elderly multigravida, third trimester: Secondary | ICD-10-CM | POA: Diagnosis not present

## 2023-10-05 DIAGNOSIS — O99210 Obesity complicating pregnancy, unspecified trimester: Secondary | ICD-10-CM

## 2023-10-05 DIAGNOSIS — Z975 Presence of (intrauterine) contraceptive device: Principal | ICD-10-CM

## 2023-10-05 DIAGNOSIS — O480 Post-term pregnancy: Secondary | ICD-10-CM | POA: Diagnosis not present

## 2023-10-05 LAB — RPR: RPR Ser Ql: NONREACTIVE

## 2023-10-05 LAB — TYPE AND SCREEN
ABO/RH(D): A POS
Antibody Screen: NEGATIVE

## 2023-10-05 LAB — CULTURE, OB URINE

## 2023-10-05 LAB — URINE CULTURE, OB REFLEX

## 2023-10-05 LAB — CBC
HCT: 38.2 % (ref 36.0–46.0)
Hemoglobin: 12.7 g/dL (ref 12.0–15.0)
MCH: 31.1 pg (ref 26.0–34.0)
MCHC: 33.2 g/dL (ref 30.0–36.0)
MCV: 93.6 fL (ref 80.0–100.0)
Platelets: 289 10*3/uL (ref 150–400)
RBC: 4.08 MIL/uL (ref 3.87–5.11)
RDW: 14.9 % (ref 11.5–15.5)
WBC: 9.3 10*3/uL (ref 4.0–10.5)
nRBC: 0 % (ref 0.0–0.2)

## 2023-10-05 MED ORDER — TERBUTALINE SULFATE 1 MG/ML IJ SOLN: 0.2500 mg | Freq: Once | INTRAMUSCULAR | Status: DC | PRN

## 2023-10-05 MED ORDER — FENTANYL CITRATE (PF) 100 MCG/2ML IJ SOLN
100.0000 ug | INTRAMUSCULAR | Status: DC | PRN
  Administered 2023-10-05: 100 ug via INTRAVENOUS
  Filled 2023-10-05: qty 2

## 2023-10-05 MED ORDER — EPHEDRINE 5 MG/ML INJ: 10.0000 mg | INTRAVENOUS | Status: DC | PRN

## 2023-10-05 MED ORDER — ACETAMINOPHEN 325 MG PO TABS: 650.0000 mg | ORAL_TABLET | ORAL | Status: DC | PRN

## 2023-10-05 MED ORDER — SOD CITRATE-CITRIC ACID 500-334 MG/5ML PO SOLN: 30.0000 mL | ORAL | Status: DC | PRN

## 2023-10-05 MED ORDER — OXYTOCIN-SODIUM CHLORIDE 30-0.9 UT/500ML-% IV SOLN
2.5000 [IU]/h | INTRAVENOUS | Status: DC
  Administered 2023-10-06: 2.5 [IU]/h via INTRAVENOUS

## 2023-10-05 MED ORDER — LACTATED RINGERS IV SOLN: 500.0000 mL | INTRAVENOUS | Status: DC | PRN

## 2023-10-05 MED ORDER — OXYCODONE-ACETAMINOPHEN 5-325 MG PO TABS: 1.0000 | ORAL_TABLET | ORAL | Status: DC | PRN

## 2023-10-05 MED ORDER — DIPHENHYDRAMINE HCL 50 MG/ML IJ SOLN
12.5000 mg | INTRAMUSCULAR | Status: DC | PRN
  Filled 2023-10-05: qty 1

## 2023-10-05 MED ORDER — FENTANYL-BUPIVACAINE-NACL 0.5-0.125-0.9 MG/250ML-% EP SOLN
12.0000 mL/h | EPIDURAL | Status: DC | PRN
  Administered 2023-10-05: 12 mL/h via EPIDURAL
  Filled 2023-10-05: qty 250

## 2023-10-05 MED ORDER — OXYTOCIN-SODIUM CHLORIDE 30-0.9 UT/500ML-% IV SOLN
1.0000 m[IU]/min | INTRAVENOUS | Status: DC
  Administered 2023-10-06: 2 m[IU]/min via INTRAVENOUS
  Filled 2023-10-05: qty 500

## 2023-10-05 MED ORDER — OXYTOCIN BOLUS FROM INFUSION
333.0000 mL | Freq: Once | INTRAVENOUS | Status: AC
  Administered 2023-10-06: 333 mL via INTRAVENOUS

## 2023-10-05 MED ORDER — LIDOCAINE HCL (PF) 1 % IJ SOLN
INTRAMUSCULAR | Status: DC | PRN
  Administered 2023-10-05: 5 mL via EPIDURAL
  Administered 2023-10-05: 3 mL via EPIDURAL
  Administered 2023-10-05: 2 mL via EPIDURAL

## 2023-10-05 MED ORDER — LACTATED RINGERS IV SOLN: 500.0000 mL | Freq: Once | INTRAVENOUS | Status: DC

## 2023-10-05 MED ORDER — PHENYLEPHRINE 80 MCG/ML (10ML) SYRINGE FOR IV PUSH (FOR BLOOD PRESSURE SUPPORT)
80.0000 ug | PREFILLED_SYRINGE | INTRAVENOUS | Status: DC | PRN
  Administered 2023-10-05: 160 ug via INTRAVENOUS

## 2023-10-05 MED ORDER — OXYCODONE-ACETAMINOPHEN 5-325 MG PO TABS: 2.0000 | ORAL_TABLET | ORAL | Status: DC | PRN

## 2023-10-05 MED ORDER — ONDANSETRON HCL 4 MG/2ML IJ SOLN: 4.0000 mg | Freq: Four times a day (QID) | INTRAMUSCULAR | Status: DC | PRN

## 2023-10-05 MED ORDER — LIDOCAINE HCL (PF) 1 % IJ SOLN: 30.0000 mL | INTRAMUSCULAR | Status: DC | PRN

## 2023-10-05 MED ORDER — MISOPROSTOL 50MCG HALF TABLET
50.0000 ug | ORAL_TABLET | Freq: Once | ORAL | Status: AC
  Administered 2023-10-05: 50 ug via ORAL
  Filled 2023-10-05: qty 1

## 2023-10-05 MED ORDER — LACTATED RINGERS IV SOLN: INTRAVENOUS | Status: DC

## 2023-10-05 MED ORDER — MISOPROSTOL 25 MCG QUARTER TABLET
25.0000 ug | ORAL_TABLET | Freq: Once | ORAL | Status: AC
  Administered 2023-10-05: 25 ug via VAGINAL
  Filled 2023-10-05: qty 1

## 2023-10-05 MED ORDER — LEVONORGESTREL 20 MCG/DAY IU IUD
1.0000 | INTRAUTERINE_SYSTEM | Freq: Once | INTRAUTERINE | Status: AC
  Administered 2023-10-06: 1 via INTRAUTERINE
  Filled 2023-10-05: qty 1

## 2023-10-05 MED ORDER — PHENYLEPHRINE 80 MCG/ML (10ML) SYRINGE FOR IV PUSH (FOR BLOOD PRESSURE SUPPORT)
80.0000 ug | PREFILLED_SYRINGE | INTRAVENOUS | Status: DC | PRN
  Administered 2023-10-05: 80 ug via INTRAVENOUS
  Filled 2023-10-05: qty 10

## 2023-10-05 NOTE — H&P (Signed)
 OBSTETRIC ADMISSION HISTORY AND PHYSICAL  Peggy Gaines is a 38 y.o. female [phone] with IUP at [redacted]w[redacted]d (dated by 25 wk US , Estimated Date of Delivery: 10/05/23) presenting for IOL due to BMI 45.   She reports +FMs, No LOF, no VB, no blurry vision, headaches or peripheral edema, and RUQ pain.    She plans on formula feeding. She request POPs for birth control.  She received her prenatal care at [facility]   Prenatal History/Complications:  - AMA - Renal UTD   Past Medical History: Past Medical History:  Diagnosis Date   BMI 35.0-35.9,adult    BMI 38.0-38.9,adult 04/30/2015   Breastfeeding (infant) 05/05/2015   Migraine without aura    Obesity    Obesity affecting pregnancy in third trimester, antepartum 04/30/2015   Postpartum care following vaginal delivery 04/30/2015    Past Surgical History: Past Surgical History:  Procedure Laterality Date   TONSILLECTOMY      Obstetrical History: OB History     Gravida  4   Para  2   Term  1   Preterm  1   AB  1   Living  2      SAB  1   IAB      Ectopic      Multiple  0   Live Births  2           Social History Social History   Socioeconomic History   Marital status: Single    Spouse name: Not on file   Number of children: Not on file   Years of education: Not on file   Highest education level: 12th grade  Occupational History   Not on file  Tobacco Use   Smoking status: Never   Smokeless tobacco: Never  Substance and Sexual Activity   Alcohol use: No   Drug use: No   Sexual activity: Not Currently    Birth control/protection: Pill  Other Topics Concern   Not on file  Social History Narrative   Not on file   Social Drivers of Health   Financial Resource Strain: Low Risk  (08/21/2023)   Overall Financial Resource Strain (CARDIA)    Difficulty of Paying Living Expenses: Not hard at all  Food Insecurity: No Food Insecurity (10/05/2023)   Hunger Vital Sign    Worried About Running Out of Food in  the Last Year: Never true    Ran Out of Food in the Last Year: Never true  Transportation Needs: No Transportation Needs (10/05/2023)   PRAPARE - Administrator, Civil Service (Medical): No    Lack of Transportation (Non-Medical): No  Physical Activity: Sufficiently Active (08/21/2023)   Exercise Vital Sign    Days of Exercise per Week: 7 days    Minutes of Exercise per Session: 60 min  Stress: No Stress Concern Present (08/21/2023)   [employer] of Occupational Health - Occupational Stress Questionnaire    Feeling of Stress : Not at all  Social Connections: Moderately Integrated (08/21/2023)   Social Connection and Isolation Panel [NHANES]    Frequency of Communication with Friends and Family: More than three times a week    Frequency of Social Gatherings with Friends and Family: Three times a week    Attends Religious Services: 1 to 4 times per year    Active Member of Clubs or Organizations: No    Attends Banker Meetings: Not on file    Marital Status: Living with partner    Family  History: Family History  Problem Relation Age of Onset   Hypertension Mother     Allergies: No Known Allergies  Medications Prior to Admission  Medication Sig Dispense Refill Last Dose/Taking   Prenatal Vit-Fe Fumarate-FA (PREPLUS) 27-1 MG TABS Take 1 tablet by mouth daily. 90 tablet 3      Review of Systems  All systems reviewed and negative except as stated in HPI.  Last menstrual period 03/07/2023, unknown if currently breastfeeding. General appearance: alert and cooperative Lungs: breathing comfortably on room air Heart: regular rate Abdomen: soft, non-tender; gravid Extremities: no edema of bilateral lower extremities Presentation: cephalic Fetal monitoring: 135/mod/+a/-d Uterine activity: None Dilation: Closed Effacement (%): Thick Exam by:: Burr Cary RN   Prenatal labs: ABO, Rh: --/--/PENDING (05/01 1141) Antibody: PENDING (05/01  1141) Rubella: 1.24 (02/17 1615) RPR: Non Reactive (02/17 1615)  HBsAg: Negative (02/17 1615)  HIV: Non Reactive (02/17 1615)  GBS: Negative/-- (04/07 1643)  2 hr Glucola wnl Genetic screening not done Anatomy US  urinary tract dilation of right side, 10mm Last US : At [redacted]w[redacted]d - cephalic presentation, EFW 2991g (35%tile), AC 28%tile  Prenatal Transfer Tool  Maternal Diabetes: No Genetic Screening: Not done Maternal Ultrasounds/Referrals: Fetal Kidney Anomalies Fetal Ultrasounds or other Referrals:  Referred to Materal Fetal Medicine  Maternal Substance Abuse:  No Significant Maternal Medications:  None Significant Maternal Lab Results:  Group B Strep negative Number of Prenatal Visits:greater than 3 verified prenatal visits Other Comments:  None  Results for orders placed or performed during the hospital encounter of 10/05/23 (from the past 24 hours)  CBC   Collection Time: 10/05/23 11:41 AM  Result Value Ref Range   WBC 9.3 4.0 - 10.5 K/uL   RBC 4.08 3.87 - 5.11 MIL/uL   Hemoglobin 12.7 12.0 - 15.0 g/dL   HCT 91.4 78.2 - 95.6 %   MCV 93.6 80.0 - 100.0 fL   MCH 31.1 26.0 - 34.0 pg   MCHC 33.2 30.0 - 36.0 g/dL   RDW 21.3 08.6 - 57.8 %   Platelets 289 150 - 400 K/uL   nRBC 0.0 0.0 - 0.2 %  Type and screen   Collection Time: 10/05/23 11:41 AM  Result Value Ref Range   ABO/RH(D) PENDING    Antibody Screen PENDING    Sample Expiration      10/08/2023,2359 Performed at [facility], [address] [place], [place] [address]     Patient Active Problem List   Diagnosis Date Noted   Obesity complicating pregnancy in third trimester 10/05/2023   Asymptomatic bacteriuria during pregnancy 09/04/2023   Known fetal anomaly, antepartum (renal UTD) 08/21/2023   Obesity affecting pregnancy, antepartum 08/18/2023   Encounter for repeat ultrasound of fetal pyelectasis in singleton pregnancy, antepartum 08/18/2023   AMA (advanced maternal age) multigravida 35+ 08/18/2023    Pregnancy, supervision, high-risk 07/24/2023    Assessment/Plan:  Peggy Gaines is a 38 y.o. [id number] at [redacted]w[redacted]d here for IOL due to BMI 45.  #Labor: Cx closed/thick, starting with dual cytotec   discussed IOL process in detail -- pt amenable to FB at next check  #Pain: Epidural #FWB: Cat I #ID:  GBS neg #MOF: Formula #MOC: IUD -- considering post-placental vs postpartum visit #Circ:  No  #BMI 45  #Fetal urinary tract dilation:  Of right urinary tract -- last measuring 16mm, will need postnatal evaluation  Melanie Spires, MD OB Fellow, Faculty Practice [facility], Center for [facility] 10/05/2023 12:41 PM

## 2023-10-05 NOTE — Anesthesia Procedure Notes (Signed)
 Epidural Patient location during procedure: OB Start time: 10/05/2023 6:48 PM End time: 10/05/2023 6:53 PM  Staffing Anesthesiologist: Peggy Bowens, MD Performed: anesthesiologist   Preanesthetic Checklist Completed: patient identified, IV checked, site marked, risks and benefits discussed, surgical consent, monitors and equipment checked, pre-op evaluation and timeout performed  Epidural Patient position: sitting Prep: DuraPrep and site prepped and draped Patient monitoring: continuous pulse ox and blood pressure Approach: midline Location: L4-L5 Injection technique: LOR air  Needle:  Needle type: Tuohy  Needle gauge: 17 G Needle length: 9 cm and 9 Needle insertion depth: 7 cm Catheter type: closed end flexible Catheter size: 19 Gauge Catheter at skin depth: 12 cm Test dose: negative  Assessment Events: blood not aspirated, no cerebrospinal fluid, injection not painful, no injection resistance, no paresthesia and negative IV test

## 2023-10-05 NOTE — Progress Notes (Signed)
 Labor Progress Note Benisha Castetter is a 38 y.o. [id number] at [redacted]w[redacted]d presented for IOL for BMI  S: Coping well, comfortable with epidural. Agreeable to AROM.   O:  BP 135/81 (BP Location: Right Arm)   Pulse 72   Temp 98.2 F (36.8 C) (Oral)   Resp 17   LMP 03/07/2023 Comment: pt unsure, states she has been stressed but think her LMP was in early October  SpO2 99%   EFM: baseline 145, accels, no decels, moderate variability TOCO: no contractions  CVE: Dilation: 5 Effacement (%): 50 Station: -2 Presentation: Vertex Exam by:: Cooper Denver, MD   A&P: 38 y.o. [id number] [redacted]w[redacted]d admitted for IOL #Labor: Progressing well. AROM clear, moderate.  #Pain: Epidural #FWB: Cat I #GBS negative  #Fetal urinary tract dilation: Of right urinary tract -- last measuring 16mm, will need postnatal evaluation   #Contraception - confirmed desire for Mirena  IUD placement post placenta, device in the room.   Darrow End, MD 9:07 PM

## 2023-10-05 NOTE — Progress Notes (Signed)
 Labor Progress Note Peggy Gaines is a 38 y.o. [id number] at [redacted]w[redacted]d presented for IOL due to BMI  S: Feeling more crampy after Cytotec   O:  BP 138/78   Pulse 73   Temp 97.6 F (36.4 C) (Axillary)   LMP 03/07/2023 Comment: pt unsure, states she has been stressed but think her LMP was in early October EFM: 140/mod/+a/-d  CVE: Dilation: 1 Effacement (%): 50 Station: -3 Presentation: Vertex Exam by:: Nyheem Binette MD   A&P: 38 y.o. [id number] [redacted]w[redacted]d here for IOL for BMI #Labor: Progressing well. Discussed FB and pt verbally consented. FB placed w 40cc water. Pt and baby tolerated well. #Pain: IV pain meds > epidural #FWB: Cat I #GBS negative  #Fetal urinary tract dilation: Of right urinary tract -- last measuring 16mm, will need postnatal evaluation   Melanie Spires, MD 5:38 PM

## 2023-10-05 NOTE — Anesthesia Preprocedure Evaluation (Signed)
 Anesthesia Evaluation  Patient identified by MRN, date of birth, ID band Patient awake    Reviewed: Allergy & Precautions, Patient's Chart, lab work & pertinent test results  Airway Mallampati: II  TM Distance: >3 FB     Dental   Pulmonary neg pulmonary ROS   Pulmonary exam normal        Cardiovascular negative cardio ROS Normal cardiovascular exam     Neuro/Psych  Headaches    GI/Hepatic negative GI ROS, Neg liver ROS,,,  Endo/Other    Class 3 obesity  Renal/GU negative Renal ROS     Musculoskeletal   Abdominal   Peds  Hematology negative hematology ROS (+)   Anesthesia Other Findings   Reproductive/Obstetrics (+) Pregnancy                             Anesthesia Physical Anesthesia Plan  ASA: 3  Anesthesia Plan: Epidural   Post-op Pain Management:    Induction:   PONV Risk Score and Plan: 2 and Treatment may vary due to age or medical condition  Airway Management Planned: Natural Airway  Additional Equipment:   Intra-op Plan:   Post-operative Plan:   Informed Consent: I have reviewed the patients History and Physical, chart, labs and discussed the procedure including the risks, benefits and alternatives for the proposed anesthesia with the patient or authorized representative who has indicated his/her understanding and acceptance.       Plan Discussed with:   Anesthesia Plan Comments:        Anesthesia Quick Evaluation

## 2023-10-06 ENCOUNTER — Encounter (HOSPITAL_COMMUNITY): Payer: Self-pay | Admitting: Family Medicine

## 2023-10-06 DIAGNOSIS — Z975 Presence of (intrauterine) contraceptive device: Secondary | ICD-10-CM

## 2023-10-06 DIAGNOSIS — O480 Post-term pregnancy: Secondary | ICD-10-CM

## 2023-10-06 DIAGNOSIS — O09523 Supervision of elderly multigravida, third trimester: Secondary | ICD-10-CM

## 2023-10-06 DIAGNOSIS — O99214 Obesity complicating childbirth: Secondary | ICD-10-CM

## 2023-10-06 DIAGNOSIS — Z3A40 40 weeks gestation of pregnancy: Secondary | ICD-10-CM

## 2023-10-06 MED ORDER — SODIUM CHLORIDE 0.9 % IV SOLN: 250.0000 mL | INTRAVENOUS | Status: DC | PRN

## 2023-10-06 MED ORDER — DIBUCAINE (PERIANAL) 1 % EX OINT: 1.0000 | TOPICAL_OINTMENT | CUTANEOUS | Status: DC | PRN

## 2023-10-06 MED ORDER — ZOLPIDEM TARTRATE 5 MG PO TABS: 5.0000 mg | ORAL_TABLET | Freq: Every evening | ORAL | Status: DC | PRN

## 2023-10-06 MED ORDER — SODIUM CHLORIDE 0.9% FLUSH: 3.0000 mL | INTRAVENOUS | Status: DC | PRN

## 2023-10-06 MED ORDER — OXYCODONE HCL 5 MG PO TABS: 5.0000 mg | ORAL_TABLET | ORAL | Status: DC | PRN

## 2023-10-06 MED ORDER — SIMETHICONE 80 MG PO CHEW: 80.0000 mg | CHEWABLE_TABLET | ORAL | Status: DC | PRN

## 2023-10-06 MED ORDER — SENNOSIDES-DOCUSATE SODIUM 8.6-50 MG PO TABS
2.0000 | ORAL_TABLET | ORAL | Status: DC
  Administered 2023-10-07: 2 via ORAL
  Filled 2023-10-06: qty 2

## 2023-10-06 MED ORDER — MEASLES, MUMPS & RUBELLA VAC IJ SOLR: 0.5000 mL | Freq: Once | INTRAMUSCULAR | Status: DC

## 2023-10-06 MED ORDER — PRENATAL MULTIVITAMIN CH
1.0000 | ORAL_TABLET | Freq: Every day | ORAL | Status: DC
  Administered 2023-10-07: 1 via ORAL
  Filled 2023-10-06: qty 1

## 2023-10-06 MED ORDER — SODIUM CHLORIDE 0.9% FLUSH
3.0000 mL | Freq: Two times a day (BID) | INTRAVENOUS | Status: DC
  Administered 2023-10-06: 3 mL via INTRAVENOUS

## 2023-10-06 MED ORDER — ONDANSETRON HCL 4 MG/2ML IJ SOLN: 4.0000 mg | INTRAMUSCULAR | Status: DC | PRN

## 2023-10-06 MED ORDER — COCONUT OIL OIL: 1.0000 | TOPICAL_OIL | Status: DC | PRN

## 2023-10-06 MED ORDER — IBUPROFEN 600 MG PO TABS
600.0000 mg | ORAL_TABLET | Freq: Four times a day (QID) | ORAL | Status: DC
  Administered 2023-10-06 – 2023-10-07 (×5): 600 mg via ORAL
  Filled 2023-10-06 (×5): qty 1

## 2023-10-06 MED ORDER — DIPHENHYDRAMINE HCL 25 MG PO CAPS: 25.0000 mg | ORAL_CAPSULE | Freq: Four times a day (QID) | ORAL | Status: DC | PRN

## 2023-10-06 MED ORDER — ACETAMINOPHEN 325 MG PO TABS: 650.0000 mg | ORAL_TABLET | ORAL | Status: DC | PRN

## 2023-10-06 MED ORDER — TETANUS-DIPHTH-ACELL PERTUSSIS 5-2.5-18.5 LF-MCG/0.5 IM SUSY: 0.5000 mL | PREFILLED_SYRINGE | Freq: Once | INTRAMUSCULAR | Status: DC

## 2023-10-06 MED ORDER — BENZOCAINE-MENTHOL 20-0.5 % EX AERO
1.0000 | INHALATION_SPRAY | CUTANEOUS | Status: DC | PRN
  Filled 2023-10-06: qty 56

## 2023-10-06 MED ORDER — ONDANSETRON HCL 4 MG PO TABS: 4.0000 mg | ORAL_TABLET | ORAL | Status: DC | PRN

## 2023-10-06 MED ORDER — WITCH HAZEL-GLYCERIN EX PADS: 1.0000 | MEDICATED_PAD | CUTANEOUS | Status: DC | PRN

## 2023-10-06 NOTE — Anesthesia Postprocedure Evaluation (Signed)
 Anesthesia Post Note  Patient: Peggy Gaines  Procedure(s) Performed: AN AD HOC LABOR EPIDURAL     Patient location during evaluation: Mother Baby Anesthesia Type: Epidural Level of consciousness: awake and alert Pain management: pain level controlled Vital Signs Assessment: post-procedure vital signs reviewed and stable Respiratory status: spontaneous breathing, nonlabored ventilation and respiratory function stable Cardiovascular status: stable Postop Assessment: no headache, no backache and epidural receding Anesthetic complications: no   No notable events documented.  Last Vitals:  Vitals:   10/06/23 1514 10/06/23 1942  BP: 128/70 111/72  Pulse: 85 82  Resp: 18 18  Temp: 36.7 C 36.6 C  SpO2:  97%    Last Pain:  Vitals:   10/06/23 1942  TempSrc: Oral  PainSc: 0-No pain   Pain Goal:                Epidural/Spinal Function Cutaneous sensation: Normal sensation (10/06/23 1942), Patient able to flex knees: Yes (10/06/23 1942), Patient able to lift hips off bed: Yes (10/06/23 1942), Back pain beyond tenderness at insertion site: No (10/06/23 1942), Progressively worsening motor and/or sensory loss: No (10/06/23 1942), Bowel and/or bladder incontinence post epidural: No (10/06/23 1942)  Ritchie Cheshire

## 2023-10-06 NOTE — Progress Notes (Signed)
 Labor Progress Note Zyrianna Kohn is a 38 y.o. [id number] at [redacted]w[redacted]d presented for IOL for BMI S: Feeling some tightening with contractions. Open to starting pitocin  given no cervical change since AROM.   O:  BP 131/85 (BP Location: Right Arm)   Pulse 69   Temp 98.2 F (36.8 C) (Oral)   Resp 17   LMP 03/07/2023 Comment: pt unsure, states she has been stressed but think her LMP was in early October  SpO2 98%   EFM: baseline 150, accels, no decels, moderate variability TOCO: no contractions  CVE: Dilation: 5 Effacement (%): 50 Station: -3 Presentation: Vertex Exam by:: Cooper Denver, MD   A&P: 38 y.o. [id number] [redacted]w[redacted]d admitted for IOL #Labor: No progress since AROM. Will start pitocin . May need IUPC for monitoring, difficult to monitor contractions with any side lying positions. Hopeful to avoid, if possible.  #Pain: Epidural #FWB: Cat I #GBS negative  Darrow End, MD 12:28 AM

## 2023-10-06 NOTE — Discharge Summary (Signed)
 Postpartum Discharge Summary  Date of Service updated***     Patient Name: Peggy Gaines DOB: 11-04-1985 MRN: [id number]  Date of admission: 10/05/2023 Delivery date:10/06/2023 Delivering provider:   Date of discharge: 10/06/2023  Admitting diagnosis: Obesity complicating pregnancy in third trimester [O99.213] Intrauterine pregnancy: [redacted]w[redacted]d     Secondary diagnosis:  Principal Problem:   Obesity complicating pregnancy in third trimester Active Problems:   NSVD (normal spontaneous vaginal delivery)   Presence of Mirena  IUD  Additional problems: Two true knots in umbilical cord!    Discharge diagnosis: Term Pregnancy Delivered                                              Post partum procedures:{Postpartum procedures:23558} Augmentation: AROM, Pitocin , Cytotec , and IP Foley Complications: {OB Labor/Delivery Complications:20784}  Hospital course: Induction of Labor With Vaginal Delivery   38 y.o. yo [id number] at [redacted]w[redacted]d was admitted to the hospital 10/05/2023 for induction of labor.  Indication for induction: AMA and BMI 45 .  Patient had an labor course uncomplicated. Membrane Rupture Time/Date: 8:56 PM,10/05/2023  Delivery Method:Vaginal, Spontaneous Operative Delivery:N/A Episiotomy: None Lacerations:  None Details of delivery can be found in separate delivery note.  Patient had a postpartum course complicated by***. Patient is discharged home 10/06/23.  Newborn Data: Birth date:10/06/2023 Birth time:5:34 AM Gender:Female Living status:Living Apgars:8 ,9  Weight:   Magnesium Sulfate received: {Mag received:30440022} BMZ received: No Rhophylac:N/A MMR:Yes T-DaP:Given prenatally Flu: No RSV Vaccine received: No Transfusion:{Transfusion received:30440034}  Immunizations received: Immunization History  Administered Date(s) Administered   Influenza,inj,Quad PF,6+ Mos 05/04/2015   MMR 05/05/2015   Tdap 08/07/2023    Physical exam  Vitals:   10/06/23 0400 10/06/23 0415  10/06/23 0500 10/06/23 0545  BP: 139/62 (!) 143/86 135/79 111/81  Pulse: 75 80 72 (!) 105  Resp:      Temp: 98.5 F (36.9 C)     TempSrc: Oral     SpO2: 97% 94% 99% 98%   General: {Exam; general:21111117} Lochia: {Desc; appropriate/inappropriate:30686::"appropriate"} Uterine Fundus: {Desc; firm/soft:30687} Incision: {Exam; incision:21111123} DVT Evaluation: {Exam; dvt:2111122} Labs: Lab Results  Component Value Date   WBC 9.3 10/05/2023   HGB 12.7 10/05/2023   HCT 38.2 10/05/2023   MCV 93.6 10/05/2023   PLT 289 10/05/2023      Latest Ref Rng & Units 06/24/2023    6:37 PM  CMP  Glucose 70 - 99 mg/dL 84   BUN 6 - 20 mg/dL 8   Creatinine 9.56 - 2.13 mg/dL 0.86   Sodium 578 - 469 mmol/L 137   Potassium 3.5 - 5.1 mmol/L 4.0   Chloride 98 - 111 mmol/L 107   CO2 22 - 32 mmol/L 20   Calcium 8.9 - 10.3 mg/dL 9.0   Total Protein 6.5 - 8.1 g/dL 6.5   Total Bilirubin 0.0 - 1.2 mg/dL 0.6   Alkaline Phos 38 - 126 U/L 70   AST 15 - 41 U/L 14   ALT 0 - 44 U/L 13    Edinburgh Score:     No data to display         No data recorded  After visit meds:  Allergies as of 10/06/2023   No Known Allergies   Med Rec must be completed prior to using this [facility]***        Discharge home in stable condition Infant Feeding: Bottle  and Breast Infant Disposition:{CHL IP OB HOME WITH [id number]:19147} Discharge instruction: per After Visit Summary and Postpartum booklet. Activity: Advance as tolerated. Pelvic rest for 6 weeks.  Diet: routine diet Future Appointments: Future Appointments  Date Time Provider Department Center  11/16/2023 10:35 AM Felipe Horton, [place] , CNM [facility]   Follow up Visit:  Follow-up Information     Center for [facility]. Schedule an appointment as soon as possible for a visit.   Specialty: Obstetrics and Gynecology Why: Routine post partum visit in 4-6 weeks Contact information: [address] [place]  [place]  [phone] [phone]               Message sent to [facility] 5/2  Please schedule this patient for a In person postpartum visit in 4 weeks with the following provider: Any provider. Additional Postpartum F/U:Postpartum Depression checkup  High risk pregnancy complicated by:  AMA, BMI 47 Delivery mode:  Vaginal, Spontaneous Anticipated Birth Control:  PP IUD placed - Mirena    10/06/2023 Darrow End, MD

## 2023-10-07 LAB — CBC
HCT: 35.7 % — ABNORMAL LOW (ref 36.0–46.0)
Hemoglobin: 11.7 g/dL — ABNORMAL LOW (ref 12.0–15.0)
MCH: 31 pg (ref 26.0–34.0)
MCHC: 32.8 g/dL (ref 30.0–36.0)
MCV: 94.4 fL (ref 80.0–100.0)
Platelets: 219 10*3/uL (ref 150–400)
RBC: 3.78 MIL/uL — ABNORMAL LOW (ref 3.87–5.11)
RDW: 15.4 % (ref 11.5–15.5)
WBC: 8 10*3/uL (ref 4.0–10.5)
nRBC: 0 % (ref 0.0–0.2)

## 2023-10-07 LAB — BIRTH TISSUE RECOVERY COLLECTION (PLACENTA DONATION)

## 2023-10-07 MED ORDER — IBUPROFEN 600 MG PO TABS: 600.0000 mg | ORAL_TABLET | Freq: Four times a day (QID) | ORAL | 0 refills | Status: AC

## 2023-10-23 ENCOUNTER — Telehealth (HOSPITAL_COMMUNITY): Payer: Self-pay | Admitting: *Deleted

## 2023-10-23 NOTE — Telephone Encounter (Signed)
 10/23/2023  Name: Peggy Gaines MRN: [id number] DOB: 10/11/1985  Reason for Call:  Transition of Care Hospital Discharge Call  Contact Status: Patient Contact Status: Complete  Language assistant needed:          Follow-Up Questions: Do You Have Any Concerns About Your Health As You Heal From Delivery?: No Do You Have Any Concerns About Your Infants Health?: No  Edinburgh Postnatal Depression Scale:  In the Past 7 Days:   Patient declined EPDS at this time. Stated, "Things are going well." PHQ2-9 Depression Scale:     Discharge Follow-up: Edinburgh score requires follow up?: N/A Patient was advised of the following resources:: Breastfeeding Support Group, Support Group  Post-discharge interventions: Reviewed Newborn Safe Sleep Practices  Signature Julien Odor, RN, 10/23/23, [phone]

## 2023-11-02 NOTE — BH Specialist Note (Deleted)
 Integrated Behavioral Health via Telemedicine Visit  11/02/2023 Peggy Gaines [id number]  Number of Integrated Behavioral Health Clinician visits: No data recorded Session Start time: No data recorded  Session End time: No data recorded Total time in minutes: No data recorded  Referring Provider: Onnie Bilis, MD Patient/Family location: Home*** [facility] Provider location: Center for [facility]  All persons participating in visit: Patient Peggy Gaines and [facility] Eufemio Strahm ***  Types of Service: {CHL AMB TYPE OF SERVICE:[phone]}  I connected with Peggy Gaines and/or Peggy Gaines's {family members:20773} via  Telephone or Video Enabled Telemedicine Application  (Video is Caregility application) and verified that I am speaking with the correct person using two identifiers. Discussed confidentiality: Yes   I discussed the limitations of telemedicine and the availability of in person appointments.  Discussed there is a possibility of technology failure and discussed alternative modes of communication if that failure occurs.  I discussed that engaging in this telemedicine visit, they consent to the provision of behavioral healthcare and the services will be billed under their insurance.  Patient and/or legal guardian expressed understanding and consented to Telemedicine visit: Yes   Presenting Concerns: Patient and/or family reports the following symptoms/concerns: *** Duration of problem: ***; Severity of problem: {Mild/Moderate/Severe:20260}  Patient and/or Family's Strengths/Protective Factors: {CHL AMB BH PROTECTIVE FACTORS:[phone]}  Goals Addressed: Patient will:  Reduce symptoms of: {IBH Symptoms:21014056}   Increase knowledge and/or ability of: {IBH Patient Tools:21014057}   Demonstrate ability to: {IBH Goals:21014053}  Progress towards Goals: {CHL AMB BH PROGRESS TOWARDS  [phone]}  Interventions: Interventions utilized:  {IBH Interventions:21014054} Standardized Assessments completed: {IBH Screening Tools:21014051}  Patient and/or Family Response: Patient agrees with treatment plan. ***  Assessment: Patient currently experiencing ***.   Patient may benefit from psychoeducation and brief therapeutic interventions regarding coping with symptoms of *** .  Plan: Follow up with behavioral health clinician on : *** Behavioral recommendations:  -*** -*** Referral(s): {IBH Referrals:21014055}  I discussed the assessment and treatment plan with the patient and/or parent/guardian. They were provided an opportunity to ask questions and all were answered. They agreed with the plan and demonstrated an understanding of the instructions.   They were advised to call back or seek an in-person evaluation if the symptoms worsen or if the condition fails to improve as anticipated.  Georgia Kipper, LCSW     07/24/2023    4:52 PM  Depression screen PHQ 2/9  Decreased Interest 0  Down, Depressed, Hopeless 0  PHQ - 2 Score 0  Altered sleeping 2  Tired, decreased energy 2  Change in appetite 0  Feeling bad or failure about yourself  0  Trouble concentrating 0  Moving slowly or fidgety/restless 0  Suicidal thoughts 0  PHQ-9 Score 4      07/24/2023    4:52 PM  GAD 7 : Generalized Anxiety Score  Nervous, Anxious, on Edge 1  Control/stop worrying 1  Worry too much - different things 1  Trouble relaxing 1  Restless 1  Easily annoyed or irritable 1  Afraid - awful might happen 0  Total GAD 7 Score 6       10/06/2023   10:00 AM  Edinburgh Postnatal Depression Scale Screening Tool  I have been able to laugh and see the funny side of things. 0  I have looked forward with enjoyment to things. 0  I have blamed myself unnecessarily when things went wrong. 0  I have been anxious or worried for  no good reason. 0  I have felt scared or panicky for no  good reason. 0  Things have been getting on top of me. 0  I have been so unhappy that I have had difficulty sleeping. 0  I have felt sad or miserable. 0  I have been so unhappy that I have been crying. 0  The thought of harming myself has occurred to me. 0  Edinburgh Postnatal Depression Scale Total 0

## 2023-11-06 ENCOUNTER — Encounter: Payer: Self-pay | Admitting: Clinical

## 2023-11-06 NOTE — Progress Notes (Signed)
 After review, MFM consult with provider is not indicated for today  Peggy Clever, MD 11/06/2023 11:00 AM  [facility]

## 2023-11-16 ENCOUNTER — Other Ambulatory Visit: Payer: Self-pay

## 2023-11-16 ENCOUNTER — Ambulatory Visit: Admitting: Advanced Practice Midwife

## 2023-11-16 ENCOUNTER — Other Ambulatory Visit (HOSPITAL_COMMUNITY)
Admission: RE | Admit: 2023-11-16 | Discharge: 2023-11-16 | Disposition: A | Discharge status: Home / Self Care | Source: Ambulatory Visit | Attending: Advanced Practice Midwife | Admitting: Advanced Practice Midwife

## 2023-11-16 VITALS — BP 138/81 | HR 70 | Wt 268.9 lb

## 2023-11-16 DIAGNOSIS — Z124 Encounter for screening for malignant neoplasm of cervix: Secondary | ICD-10-CM

## 2023-11-16 DIAGNOSIS — O0993 Supervision of high risk pregnancy, unspecified, third trimester: Secondary | ICD-10-CM

## 2023-11-16 NOTE — Progress Notes (Signed)
 Post Partum Visit Note  Peggy Gaines is a 38 y.o. [phone] female who presents for a postpartum visit. She is 6 weeks postpartum following a normal spontaneous vaginal delivery.  I have fully reviewed the prenatal and intrapartum course. The delivery was at 40 weeks 1 day gestational weeks.  Anesthesia: epidural. Postpartum course has been good. Baby is doing well. Baby is feeding by bottle - Similac Sensitive RS. Bleeding pink and red. Bowel function is normal. Bladder function is normal. Patient is sexually active. Contraception method is IUD was expelled 1 week after delivery. Has been taking POPs since,  Postpartum depression screening: negative.   The pregnancy intention screening data noted above was reviewed. Potential methods of contraception were discussed. The patient elected to proceed with IUD    Health Maintenance Due  Topic Date Due   HPV VACCINES (1 - 3-dose series) Never done   Cervical Cancer Screening (HPV/Pap Cotest)  Never done   COVID-19 Vaccine (1 - 2024-25 season) Never done    The following portions of the patient's history were reviewed and updated as appropriate: allergies, current medications, past family history, past medical history, past social history, past surgical history, and problem list.  Review of Systems Pertinent items are noted in HPI.  Objective:  LMP 03/07/2023 Comment: pt unsure, states she has been stressed but think her LMP was in early October   General:  alert, cooperative, appears stated age, and no distress   Breasts:  not indicated  Lungs: Nml rate and effort   Heart:  regular rate and rhythm  Abdomen: soft, non-tender; bowel sounds normal; no masses,  no organomegaly   Wound NA  GU exam:  normal       Assessment:     1. Supervision of high risk pregnancy in third trimester (Primary)  2. Pap smear for cervical cancer screening - Cytology - PAP( [place])   Nml postpartum exam.   F/U for IUD insertion   Plan:    Essential components of care per ACOG recommendations:  1.  Mood and well being: Patient with negative depression screening today. Reviewed local resources for support.  - Patient tobacco use? No.   - hx of drug use? No.    2. Infant care and feeding:  -Patient currently breastmilk feeding? No.  -Social determinants of health (SDOH) reviewed in EPIC. No concerns. The following needs were identified none.  3. Sexuality, contraception and birth spacing - Patient does not want a pregnancy in the next year.  Desired family size is 3 children.  - Reviewed reproductive life planning. Reviewed contraceptive methods based on pt preferences and effectiveness.  Patient desired Oral Contraceptive today.   - Discussed birth spacing of 18 months  4. Sleep and fatigue -Encouraged family/partner/community support of 4 hrs of uninterrupted sleep to help with mood and fatigue  5. Physical Recovery  - Discussed patients delivery and complications. She describes her labor as good. - Patient had a Vaginal, no problems at delivery. Patient had  no laceration. Perineal healing reviewed. Patient expressed understanding - Patient has urinary incontinence? No. - Patient is safe to resume physical and sexual activity  6.  Health Maintenance - HM due items addressed Yes - Last pap smear No results found for: DIAGPAP Pap smear done at today's visit.  -Breast Cancer screening indicated? No.   7. Chronic Disease/Pregnancy Condition follow up: None  - PCP follow up  Jeanetta Bellamy, RN Center for [employer], [facility]

## 2023-11-22 ENCOUNTER — Ambulatory Visit: Admitting: Clinical

## 2023-11-22 DIAGNOSIS — Z9189 Other specified personal risk factors, not elsewhere classified: Secondary | ICD-10-CM

## 2023-11-22 LAB — CYTOLOGY - PAP
Comment: NEGATIVE
Comment: NEGATIVE
Comment: NEGATIVE
Diagnosis: UNDETERMINED — AB
HPV 16: NEGATIVE
HPV 18 / 45: NEGATIVE
High risk HPV: POSITIVE — AB

## 2023-11-22 NOTE — Patient Instructions (Signed)
 Center for [facility] at [facility] for Women [address] [place], [place] [address] [phone] (main office) [phone] [facility] office)  New Parent Support Groups [email or web address]

## 2023-11-22 NOTE — BH Specialist Note (Signed)
 Integrated Behavioral Health via Telemedicine Visit  11/22/2023 Peggy Gaines [id number]  Number of Integrated Behavioral Health Clinician visits: 1- Initial Visit  Session Start time: 0813   Session End time: 0822  Total time in minutes: 9    Referring Provider: Onnie Bilis, MD Patient/Family location: Home [facility] Provider location: Center for [facility] at [facility]  All persons participating in visit: Patient Peggy Gaines and [facility] Cabe Lashley   Types of Service: Individual psychotherapy and Video visit  I connected with Peggy Gaines and/or Peggy Gaines's n/a via  Telephone or Video Enabled Telemedicine Application  (Video is Caregility application) and verified that I am speaking with the correct person using two identifiers. Discussed confidentiality: Yes   I discussed the limitations of telemedicine and the availability of in person appointments.  Discussed there is a possibility of technology failure and discussed alternative modes of communication if that failure occurs.  I discussed that engaging in this telemedicine visit, they consent to the provision of behavioral healthcare and the services will be billed under their insurance.  Patient and/or legal guardian expressed understanding and consented to Telemedicine visit: Yes   Presenting Concerns: Patient and/or family reports the following symptoms/concerns: Pt is adjusting well to new motherhood; sleeping as able when baby sleeps, good appetite, good support at home; has returned to work in supportive workplace; no questions or concerns today.    Patient and/or Family's Strengths/Protective Factors: Social connections, Social and Patent attorney, [place] supports in place (healthy food, safe environments, etc.), Sense of purpose, and Physical Health (exercise, healthy diet, medication compliance, etc.)  Goals Addressed:    Demonstrate ability to: Maintain  healthy adjustment to new life circumstances  Progress towards Goals: Achieved    Interventions: Interventions utilized:  Functional Assessment of ADLs, Psychoeducation and/or Health Education, and Supportive Reflection Standardized Assessments completed: Not Needed    Patient and/or Family Response: Patient agrees with treatment plan.   Clinical Assessment/Diagnosis  No diagnosis found.   Patient may benefit from psychoeducation and brief therapeutic interventions regarding maintaining healthy adjustment.  .  Plan: Follow up with behavioral health clinician on : Call Peggy Gaines at [phone], as needed. Behavioral recommendations:  -Continue prioritizing healthy self-care (regular meals, adequate rest; allowing practical help from supportive friends and family)  -Consider new mom support group as needed at either [email or web address] or [email or web address]   Referral(s): Integrated Art gallery manager (In Clinic) and [employer]:  new mom support  I discussed the assessment and treatment plan with the patient and/or parent/guardian. They were provided an opportunity to ask questions and all were answered. They agreed with the plan and demonstrated an understanding of the instructions.   They were advised to call back or seek an in-person evaluation if the symptoms worsen or if the condition fails to improve as anticipated.  Georgia Kipper, LCSW     07/24/2023    4:52 PM  Depression screen PHQ 2/9  Decreased Interest 0  Down, Depressed, Hopeless 0  PHQ - 2 Score 0  Altered sleeping 2  Tired, decreased energy 2  Change in appetite 0  Feeling bad or failure about yourself  0  Trouble concentrating 0  Moving slowly or fidgety/restless 0  Suicidal thoughts 0  PHQ-9 Score 4      07/24/2023    4:52 PM  GAD 7 : Generalized Anxiety Score  Nervous, Anxious, on Edge 1  Control/stop worrying 1  Worry too much - different things 1  Trouble relaxing 1  Restless 1  Easily annoyed or irritable 1  Afraid - awful might happen 0  Total GAD 7 Score 6      11/16/2023   11:09 AM 10/06/2023   10:00 AM  Edinburgh Postnatal Depression Scale Screening Tool  I have been able to laugh and see the funny side of things. 0 0  I have looked forward with enjoyment to things. 0 0  I have blamed myself unnecessarily when things went wrong. 1 0  I have been anxious or worried for no good reason. 0 0  I have felt scared or panicky for no good reason. 1 0  Things have been getting on top of me. 1 0  I have been so unhappy that I have had difficulty sleeping. 0 0  I have felt sad or miserable. 0 0  I have been so unhappy that I have been crying. 0 0  The thought of harming myself has occurred to me. 0 0  Edinburgh Postnatal Depression Scale Total 3 0      Data saved with a previous flowsheet row definition

## 2023-12-24 ENCOUNTER — Ambulatory Visit: Payer: Self-pay | Admitting: Advanced Practice Midwife

## 2023-12-24 ENCOUNTER — Encounter: Payer: Self-pay | Admitting: Advanced Practice Midwife

## 2023-12-24 DIAGNOSIS — R87610 Atypical squamous cells of undetermined significance on cytologic smear of cervix (ASC-US): Secondary | ICD-10-CM | POA: Insufficient documentation

## 2024-01-01 ENCOUNTER — Ambulatory Visit: Admitting: Obstetrics and Gynecology

## 2024-02-01 ENCOUNTER — Ambulatory Visit: Admitting: Obstetrics and Gynecology

## 2024-03-19 ENCOUNTER — Telehealth: Payer: Self-pay | Admitting: Family Medicine

## 2024-03-19 NOTE — Telephone Encounter (Signed)
 Open in error
